# Patient Record
Sex: Female | Born: 1985 | Race: Black or African American | Hispanic: No | Marital: Single | Smoking: Never smoker
Health system: Southern US, Community
[De-identification: ages and names within clinical notes are randomized; demographics above are authoritative.]

## PROBLEM LIST (undated history)

## (undated) DIAGNOSIS — Z789 Other specified health status: Secondary | ICD-10-CM

## (undated) HISTORY — PX: NO PAST SURGERIES: SHX2092

## (undated) HISTORY — DX: Other specified health status: Z78.9

---
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
## 2006-03-22 ENCOUNTER — Ambulatory Visit (HOSPITAL_COMMUNITY): Admission: RE | Admit: 2006-03-22 | Discharge: 2006-03-22 | Payer: Self-pay | Admitting: Family Medicine

## 2006-04-19 ENCOUNTER — Ambulatory Visit (HOSPITAL_COMMUNITY): Admission: RE | Admit: 2006-04-19 | Discharge: 2006-04-19 | Payer: Self-pay | Admitting: Family Medicine

## 2006-05-03 ENCOUNTER — Ambulatory Visit (HOSPITAL_COMMUNITY): Admission: RE | Admit: 2006-05-03 | Discharge: 2006-05-03 | Payer: Self-pay | Admitting: Family Medicine

## 2006-05-25 ENCOUNTER — Ambulatory Visit (HOSPITAL_COMMUNITY): Admission: RE | Admit: 2006-05-25 | Discharge: 2006-05-25 | Payer: Self-pay | Admitting: Family Medicine

## 2006-06-22 ENCOUNTER — Ambulatory Visit (HOSPITAL_COMMUNITY): Admission: RE | Admit: 2006-06-22 | Discharge: 2006-06-22 | Payer: Self-pay | Admitting: Family Medicine

## 2006-07-20 ENCOUNTER — Ambulatory Visit (HOSPITAL_COMMUNITY): Admission: RE | Admit: 2006-07-20 | Discharge: 2006-07-20 | Payer: Self-pay | Admitting: Family Medicine

## 2006-08-17 ENCOUNTER — Ambulatory Visit (HOSPITAL_COMMUNITY): Admission: RE | Admit: 2006-08-17 | Discharge: 2006-08-17 | Payer: Self-pay | Admitting: Family Medicine

## 2007-12-25 ENCOUNTER — Emergency Department (HOSPITAL_COMMUNITY): Admission: EM | Admit: 2007-12-25 | Discharge: 2007-12-25 | Payer: Self-pay | Admitting: Emergency Medicine

## 2008-02-19 ENCOUNTER — Emergency Department (HOSPITAL_COMMUNITY): Admission: EM | Admit: 2008-02-19 | Discharge: 2008-02-19 | Payer: Self-pay | Admitting: Family Medicine

## 2008-12-23 ENCOUNTER — Ambulatory Visit (HOSPITAL_COMMUNITY): Admission: RE | Admit: 2008-12-23 | Discharge: 2008-12-23 | Payer: Self-pay | Admitting: Obstetrics & Gynecology

## 2009-02-04 ENCOUNTER — Encounter: Payer: Self-pay | Admitting: Family Medicine

## 2009-02-04 ENCOUNTER — Ambulatory Visit (HOSPITAL_COMMUNITY): Admission: RE | Admit: 2009-02-04 | Discharge: 2009-02-04 | Payer: Self-pay | Admitting: Family Medicine

## 2009-02-11 IMAGING — US US OB FOLLOW-UP
2 series · 14 of 28 positions shown · non-contrast
Comparison: none

OBSTETRICAL ULTRASOUND:
 This ultrasound was performed in The [HOSPITAL], and the [facility] report will be stored to [REDACTED] PACS.

[Series 1: us ob follow-up · 13 of 100 slices shown (1 of 2)]
[im 4/100]
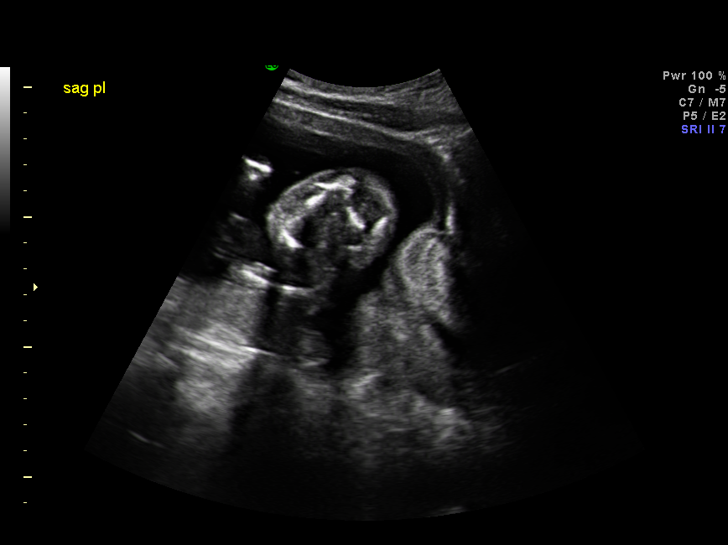
[im 12/100]
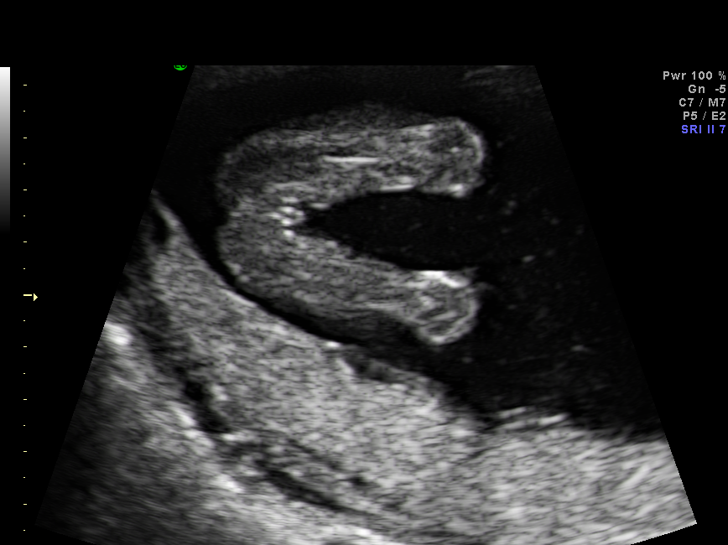
[im 20/100]
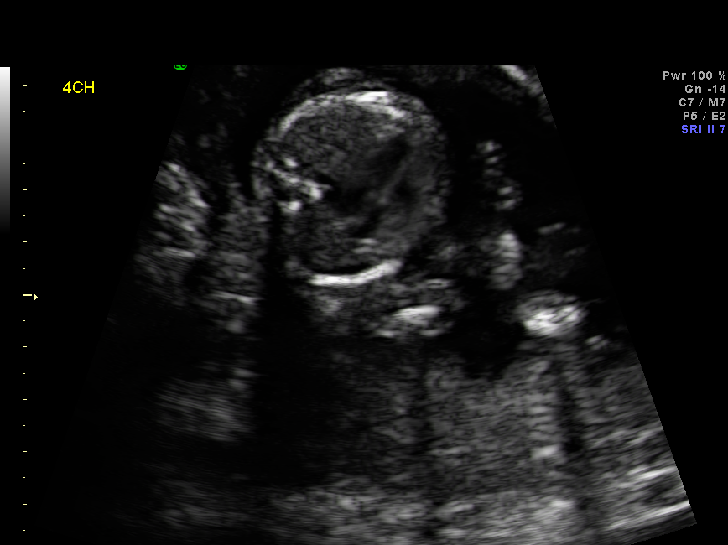
[im 28/100]
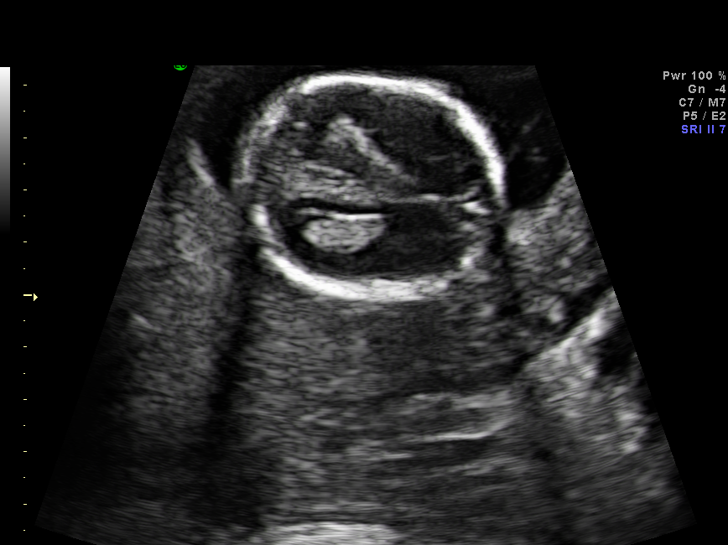
[im 36/100]
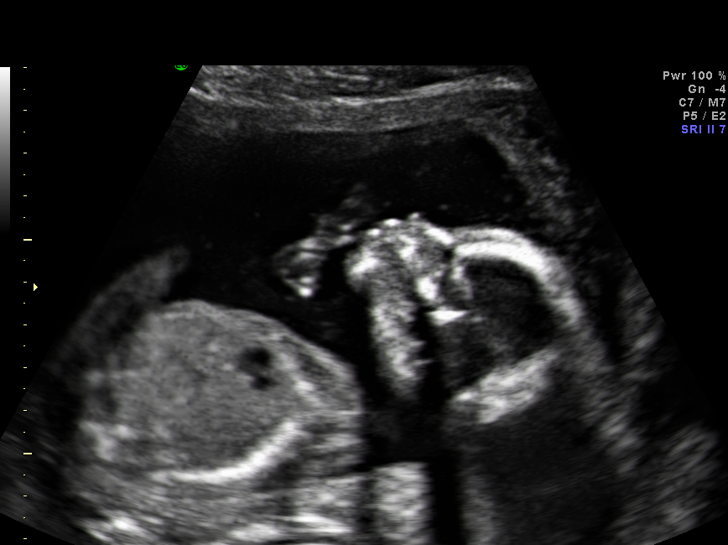
[im 44/100]
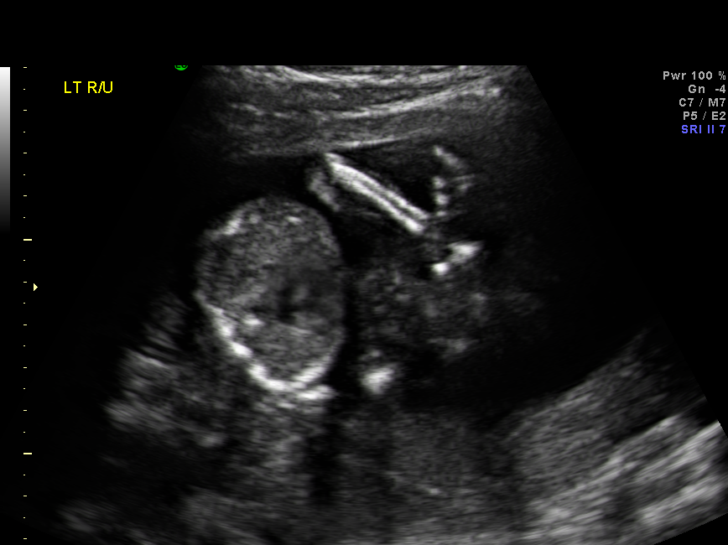
[im 52/100]
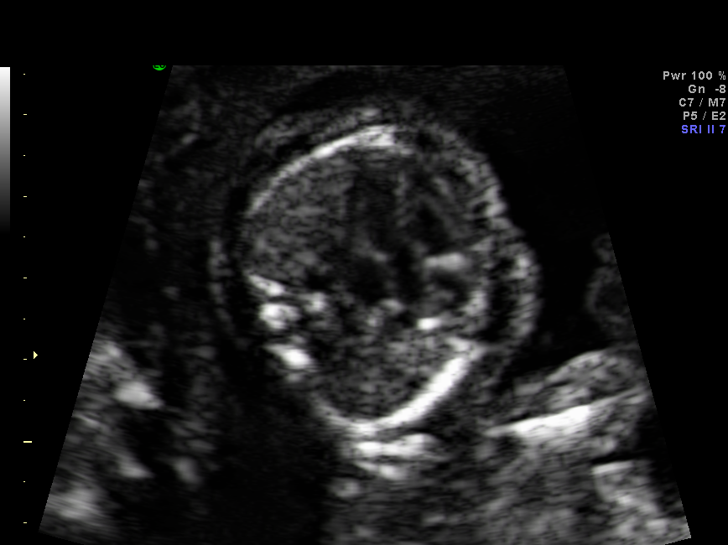
[im 60/100]
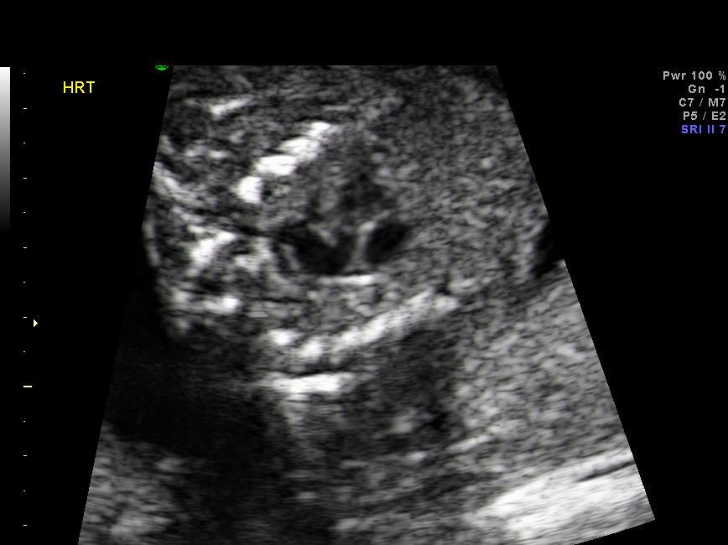
[im 68/100]
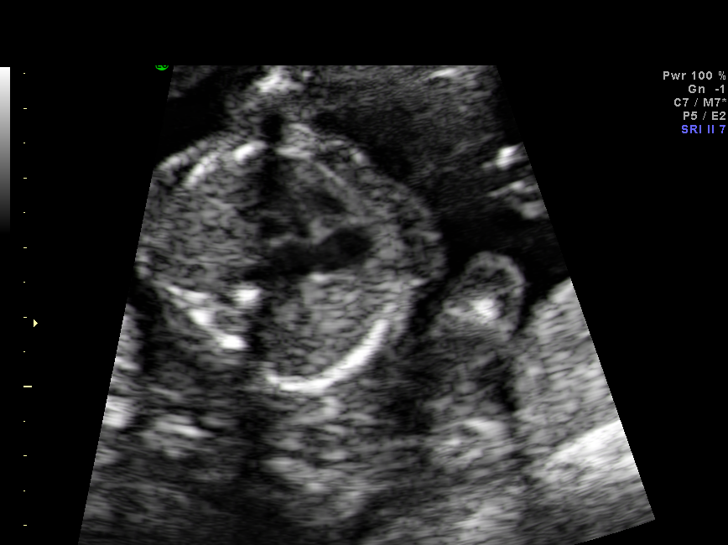
[im 76/100]
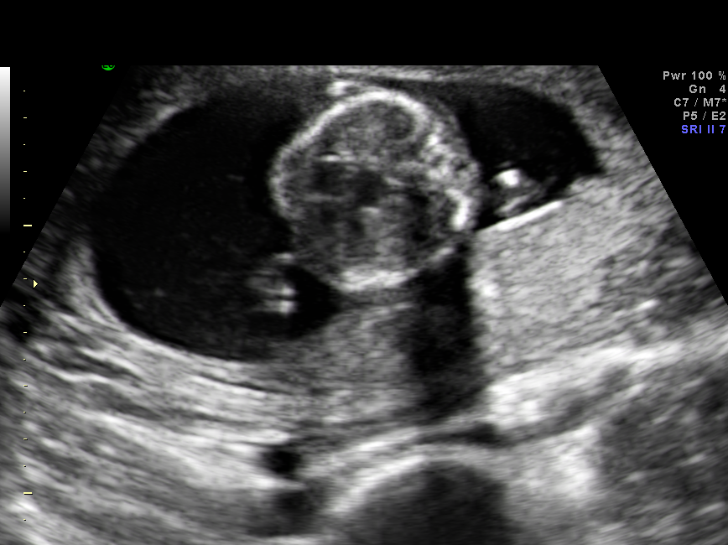
[im 84/100]
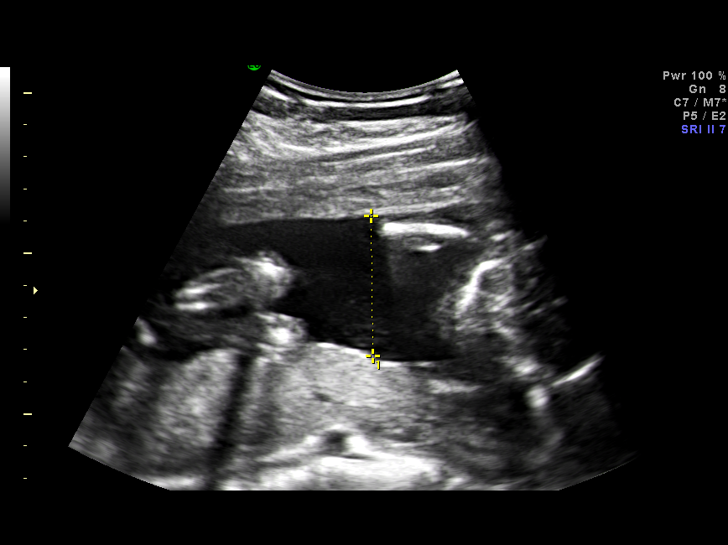
[im 92/100]
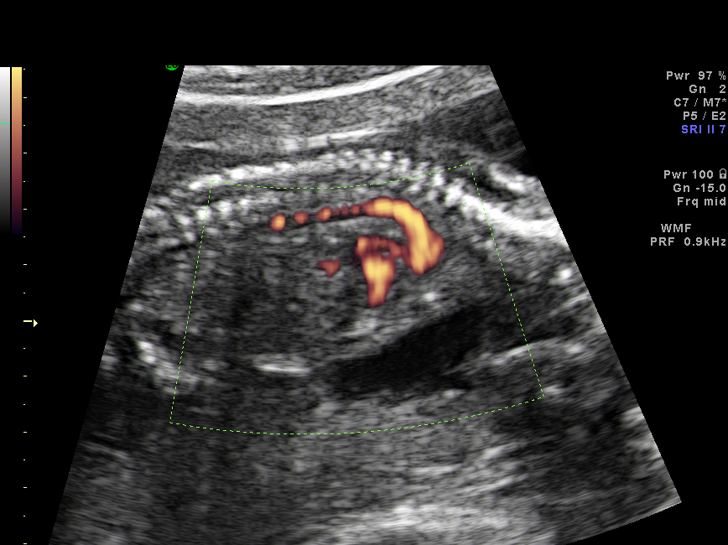
[im 100/100]
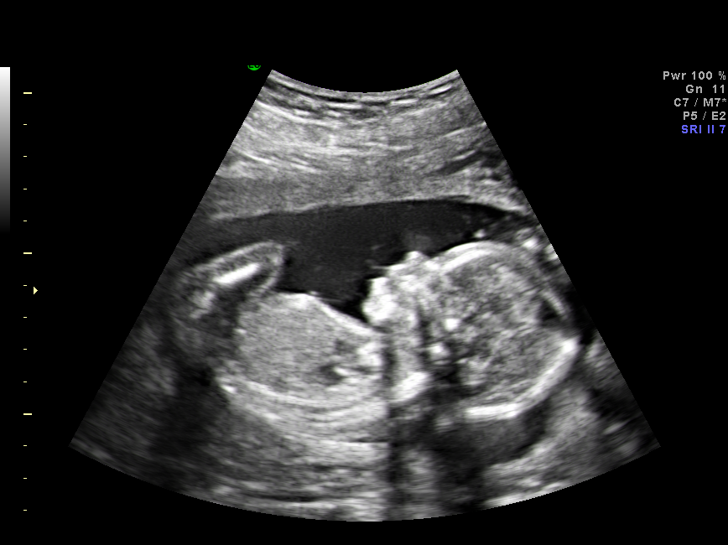

[Series 1: us ob follow-up · 9 acquisitions, 1 frame shown (2 of 2)]
[im 9/9]
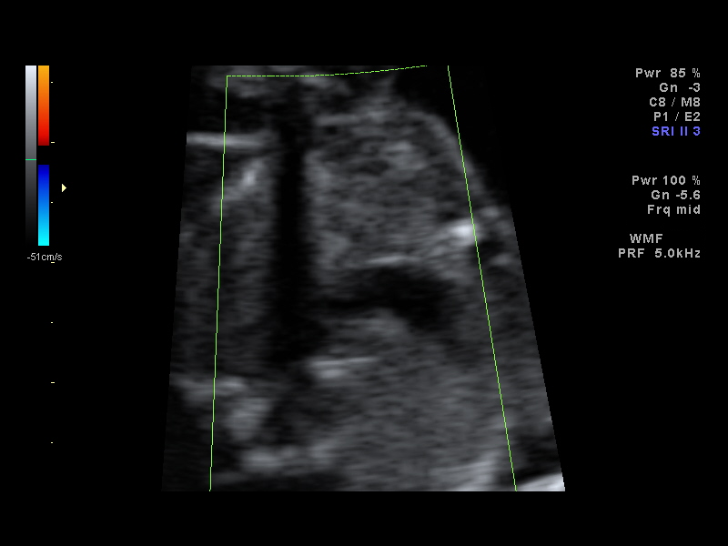

[14 of 28 positions shown; findings below may reference images not displayed]

IMPRESSION: The [facility] report has also been faxed to the ordering physician.

## 2009-05-29 ENCOUNTER — Ambulatory Visit: Payer: Self-pay | Admitting: Family Medicine

## 2009-05-29 ENCOUNTER — Inpatient Hospital Stay (HOSPITAL_COMMUNITY): Admission: AD | Admit: 2009-05-29 | Discharge: 2009-05-31 | Payer: Self-pay | Admitting: Obstetrics & Gynecology

## 2010-03-30 LAB — CBC
HCT: 31.5 % — ABNORMAL LOW (ref 36.0–46.0)
Hemoglobin: 10.9 g/dL — ABNORMAL LOW (ref 12.0–15.0)
MCHC: 34.5 g/dL (ref 30.0–36.0)
MCV: 94 fL (ref 78.0–100.0)
MCV: 94.2 fL (ref 78.0–100.0)
Platelets: 187 10*3/uL (ref 150–400)
Platelets: 189 10*3/uL (ref 150–400)
RBC: 3.34 MIL/uL — ABNORMAL LOW (ref 3.87–5.11)
RBC: 3.34 MIL/uL — ABNORMAL LOW (ref 3.87–5.11)
RDW: 14.5 % (ref 11.5–15.5)

## 2010-03-30 LAB — RPR: RPR Ser Ql: NONREACTIVE

## 2010-09-15 ENCOUNTER — Encounter: Payer: Self-pay | Admitting: *Deleted

## 2010-09-15 ENCOUNTER — Emergency Department (HOSPITAL_BASED_OUTPATIENT_CLINIC_OR_DEPARTMENT_OTHER)
Admission: EM | Admit: 2010-09-15 | Discharge: 2010-09-15 | Disposition: A | Discharge status: Home / Self Care | Payer: Self-pay | Attending: Emergency Medicine | Admitting: Emergency Medicine

## 2010-09-15 DIAGNOSIS — K0889 Other specified disorders of teeth and supporting structures: Secondary | ICD-10-CM

## 2010-09-15 DIAGNOSIS — K0381 Cracked tooth: Secondary | ICD-10-CM | POA: Insufficient documentation

## 2010-09-15 DIAGNOSIS — K089 Disorder of teeth and supporting structures, unspecified: Secondary | ICD-10-CM | POA: Insufficient documentation

## 2010-09-15 MED ORDER — PENICILLIN V POTASSIUM 500 MG PO TABS: 500.0000 mg | ORAL_TABLET | Freq: Three times a day (TID) | ORAL | Status: AC

## 2010-09-15 MED ORDER — PENICILLIN V POTASSIUM 250 MG PO TABS
500.0000 mg | ORAL_TABLET | Freq: Once | ORAL | Status: AC
  Administered 2010-09-15: 500 mg via ORAL
  Filled 2010-09-15 (×2): qty 1

## 2010-09-15 MED ORDER — ACETAMINOPHEN-CODEINE #3 300-30 MG PO TABS: 1.0000 | ORAL_TABLET | Freq: Four times a day (QID) | ORAL | Status: AC | PRN

## 2010-09-15 NOTE — ED Notes (Signed)
C/o tooth pain for several days

## 2010-09-15 NOTE — ED Provider Notes (Signed)
History     CSN: [id number] Arrival date & time: 09/15/2010  8:03 PM  Chief Complaint  Patient presents with  . Dental Pain   HPI Comments: Patient reports intermittent dental pain in the right upper mouth for a long time. However in the past she will take Tylenol and it would improve. Patient reports she did crack the teeth in that area a couple years prior when she bit into something hard. Over the last couple of days she's had worsening pain to that area improved after taking ibuprofen today the pain is persistent. No fevers or facial swelling. No difficulty opening her mouth, breathing, swallowing. She didn't call a dentist but she was not able to afford her co-pays her she came here to the emergency department.  Patient is a 25 y.o. female presenting with tooth pain. The history is provided by the patient.  Dental PainPrimary symptoms do not include shortness of breath.    History reviewed. No pertinent past medical history.  History reviewed. No pertinent past surgical history.  History reviewed. No pertinent family history.  History  Substance Use Topics  . Smoking status: Current Everyday Smoker -- 0.5 packs/day  . Smokeless tobacco: Not on file  . Alcohol Use: No    OB History    Grav Para Term Preterm Abortions TAB SAB Ect Mult Living                  Review of Systems  Constitutional: Negative.   HENT: Positive for dental problem.   Respiratory: Negative for shortness of breath.     Physical Exam  BP 128/84  Pulse 79  Temp(Src) 99 F (37.2 C) (Oral)  Resp 16  SpO2 100%  Physical Exam  Constitutional: She appears well-developed and well-nourished.  HENT:  Mouth/Throat:    Neck: Normal range of motion. Neck supple.  Pulmonary/Chest: Effort normal. No respiratory distress.  Lymphadenopathy:    She has no cervical adenopathy.    ED Course  Procedures  MDM Prescription for codeine, PCN and refer to on call dentist.        Gavin Pound. Oletta Lamas,  MD 09/15/10 2105

## 2010-09-15 NOTE — Discharge Instructions (Signed)
Dental Pain (Tooth Ache)   A tooth ache may be caused by cavities (tooth decay). Cavities expose the nerve of the tooth to air and hot or cold temperatures. It may come from an infection or abscess (also called a boil or furuncle) around your tooth. It is also often caused by dental caries (tooth decay). This causes the pain you are having.   DIAGNOSIS   Your caregiver can diagnose this problem by exam.   TREATMENT   If caused by an infection, it may be treated with medications which kill germs (antibiotics) and pain medications as prescribed by your caregiver. Take medications as directed.   Only take over-the-counter or prescription medicines for pain, discomfort, or fever as directed by your caregiver.   Whether the tooth ache today is caused by infection or dental disease, you should see your dentist as soon as possible for further care.   SEEK MEDICAL CARE:   The exam and treatment you received today has been provided on an emergency basis only. This is not a substitute for complete medical or dental care. If your problem worsens or new problems (symptoms) appear, and you are unable to meet with your dentist, call or return to this location.   SEEK IMMEDIATE MEDICAL CARE IF:   You develop a fever over 102.   You develop redness and swelling of your face, jaw, or neck.   You are unable to open your mouth.   You have severe pain uncontrolled by pain medicine.   MAKE SURE YOU:   Understand these instructions.   Will watch your condition.   Will get help right away if you are not doing well or get worse.   Document Released: 12/28/2004 Document Re-Released: 12/11/2007   ExitCare Patient Information 2011 [employer].

## 2011-08-02 ENCOUNTER — Emergency Department (HOSPITAL_COMMUNITY)
Admission: EM | Admit: 2011-08-02 | Discharge: 2011-08-02 | Disposition: A | Discharge status: Home / Self Care | Payer: Self-pay | Attending: Emergency Medicine | Admitting: Emergency Medicine

## 2011-08-02 DIAGNOSIS — F17200 Nicotine dependence, unspecified, uncomplicated: Secondary | ICD-10-CM | POA: Insufficient documentation

## 2011-08-02 DIAGNOSIS — K089 Disorder of teeth and supporting structures, unspecified: Secondary | ICD-10-CM | POA: Insufficient documentation

## 2011-08-02 DIAGNOSIS — K0889 Other specified disorders of teeth and supporting structures: Secondary | ICD-10-CM

## 2011-08-02 MED ORDER — IBUPROFEN 600 MG PO TABS: 600.0000 mg | ORAL_TABLET | Freq: Three times a day (TID) | ORAL | Status: AC | PRN

## 2011-08-02 MED ORDER — IBUPROFEN 200 MG PO TABS
600.0000 mg | ORAL_TABLET | Freq: Once | ORAL | Status: AC
  Administered 2011-08-02: 600 mg via ORAL
  Filled 2011-08-02: qty 3

## 2011-08-02 MED ORDER — HYDROCODONE-ACETAMINOPHEN 5-500 MG PO TABS: 1.0000 | ORAL_TABLET | Freq: Four times a day (QID) | ORAL | Status: AC | PRN

## 2011-08-02 NOTE — ED Provider Notes (Signed)
History     CSN: [id number]  Arrival date & time 08/02/11  4098   First MD Initiated Contact with Patient 08/02/11 [phone]     Chief complaint: toothache  (Consider location/radiation/quality/duration/timing/severity/associated sxs/prior treatment) The history is provided by the patient.  pt c/o toothpain since yesterday. Constant, dull, non radiating. States started after bit down on a chip. Was mod-sev initially but now much better. No meds/rx pta. No local dentist. No facial swelling or erythema. No fever or chills.     No past medical history on file.  No past surgical history on file.  No family history on file.  History  Substance Use Topics  . Smoking status: Current Everyday Smoker -- 0.5 packs/day  . Smokeless tobacco: Not on file  . Alcohol Use: No    OB History    Grav Para Term Preterm Abortions TAB SAB Ect Mult Living                  Review of Systems  Constitutional: Negative for fever.  HENT: Negative for sore throat and trouble swallowing.   Neurological: Negative for headaches.    Allergies  Review of patient's allergies indicates no known allergies.  Home Medications   Current Outpatient Rx  Name Route Sig Dispense Refill  . ACETAMINOPHEN 500 MG PO TABS Oral Take 1,000 mg by mouth every 6 (six) hours as needed. pain     . IBUPROFEN 200 MG PO TABS Oral Take 400 mg by mouth every 6 (six) hours as needed. pain       BP 127/89  Pulse 72  Temp 98.3 F (36.8 C) (Oral)  Resp 18  SpO2 100%  Physical Exam  Nursing note and vitals reviewed. Constitutional: She appears well-developed and well-nourished. No distress.  HENT:       Left upper tooth tenderness. Tooth firmly intact, not fractured. No gum swelling. No trismus. No facial swelling or redness.   Eyes: Conjunctivae are normal. No scleral icterus.  Neck: Neck supple. No tracheal deviation present.  Cardiovascular: Normal rate.   Pulmonary/Chest: Effort normal. No respiratory distress.    Abdominal: Normal appearance.  Musculoskeletal: She exhibits no edema.  Lymphadenopathy:    She has no cervical adenopathy.  Neurological: She is alert.  Skin: Skin is warm and dry. No rash noted.  Psychiatric: She has a normal mood and affect.    ED Course  Procedures (including critical care time)    MDM  Motrin po. Will give small quantity rxn vicodin.         Suzi Roots, MD 08/02/11 [phone]

## 2011-10-18 IMAGING — US US OB DETAIL+14 WK
2 series · 14 of 28 positions shown · non-contrast
Comparison: none

OBSTETRICAL ULTRASOUND:
 This ultrasound exam was performed in the [HOSPITAL] Ultrasound Department.  The OB US report was generated in the AS system, and faxed to the ordering physician.  This report is also available in [HOSPITAL]?s [employer] and in [REDACTED] PACS.

[Series 1: us ob detail +14 wk · 0.22mm/px · 1 of 3 slices shown (1 of 2)]
[im 3/3]
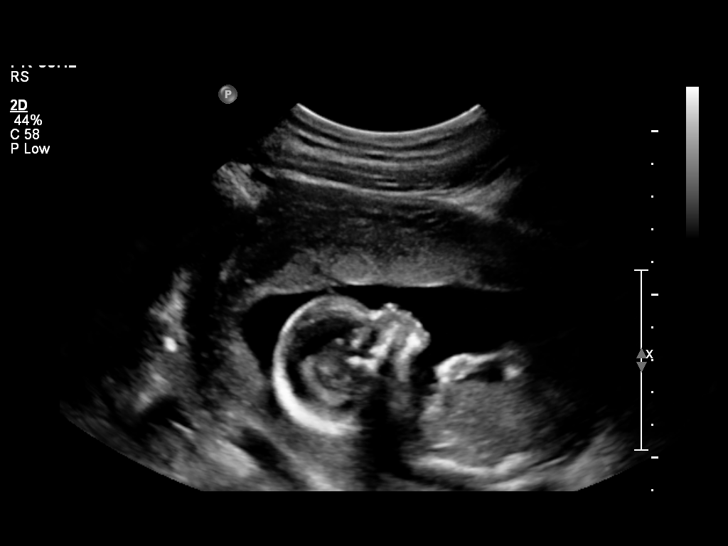

[Series 1: us ob detail +14 wk · 13 of 37 slices shown (2 of 2)]
[im 2/37]
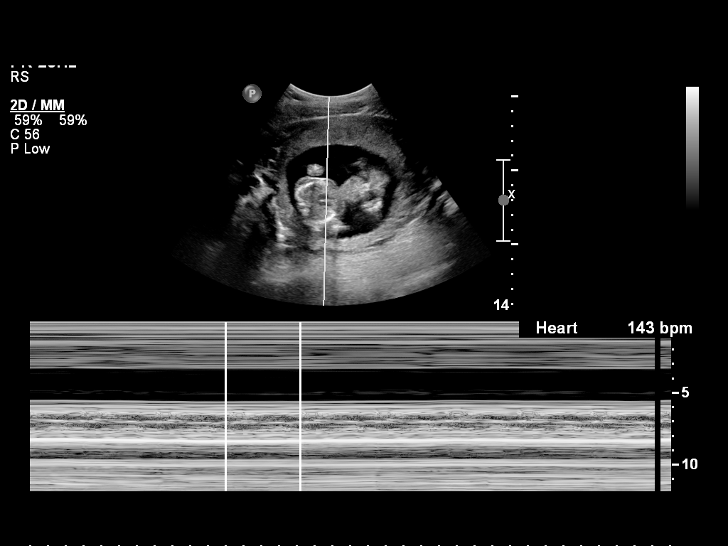
[im 5/37]
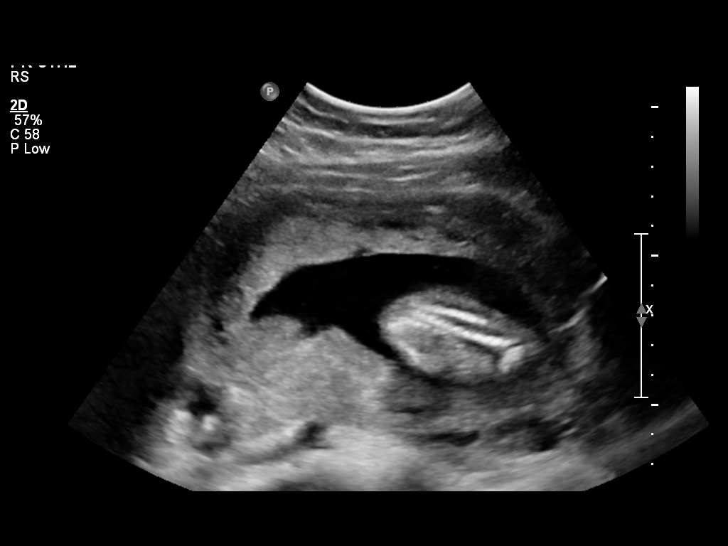
[im 8/37]
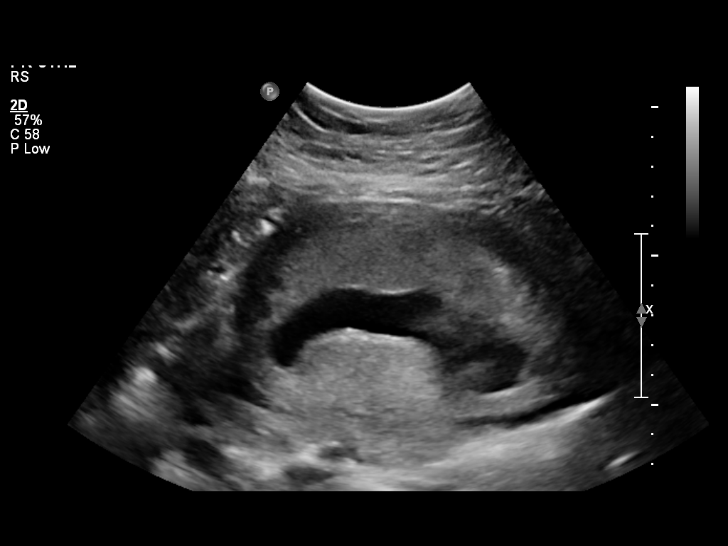
[im 11/37]
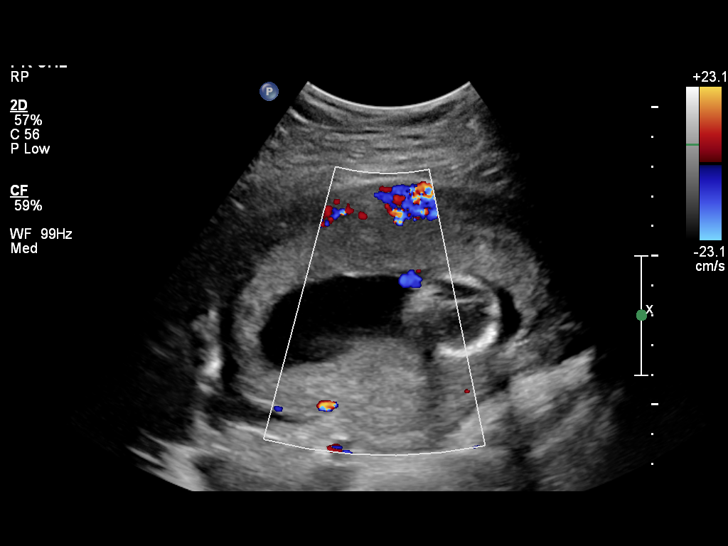
[im 13/37]
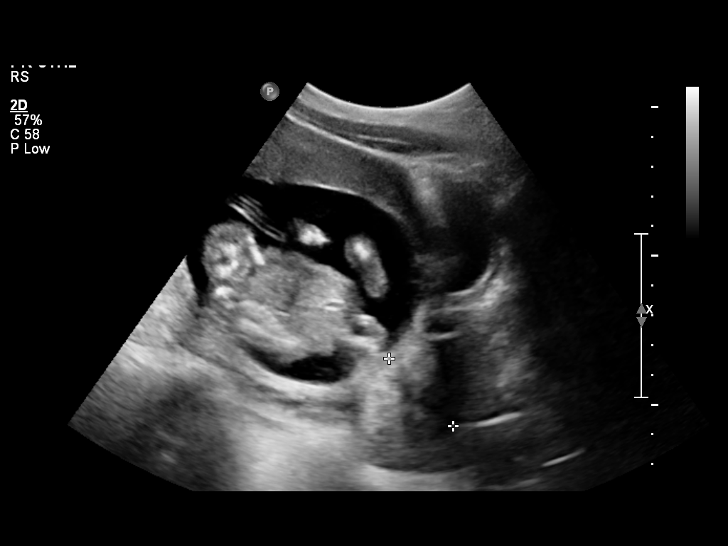
[im 16/37]
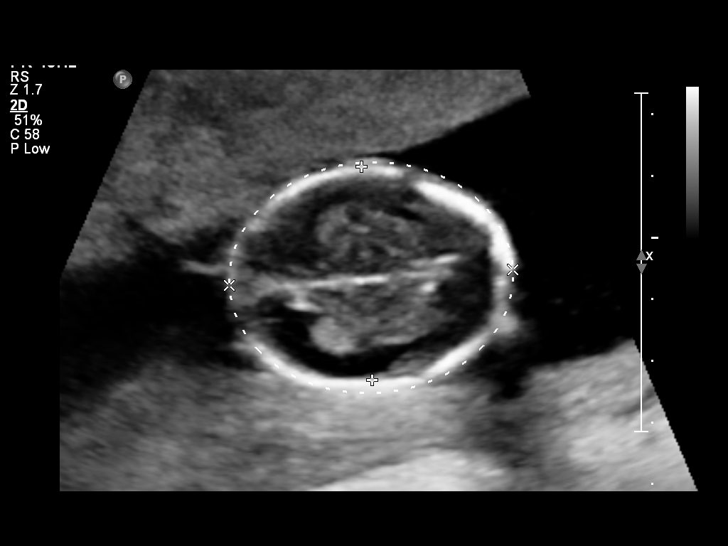
[im 19/37]
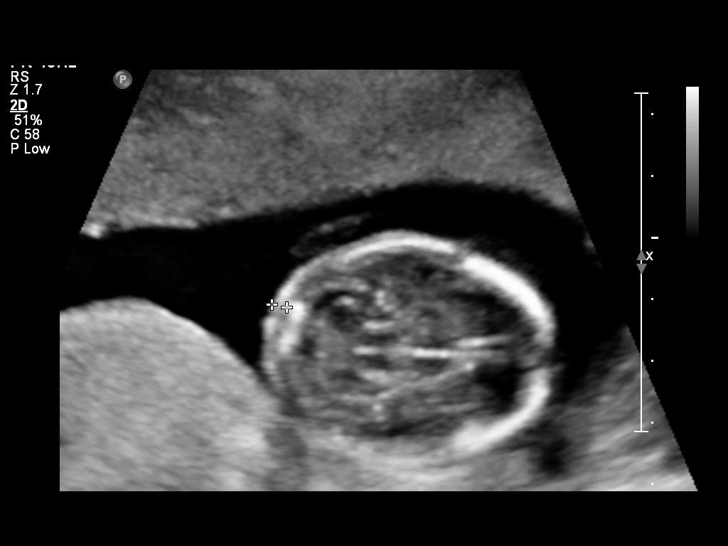
[im 22/37]
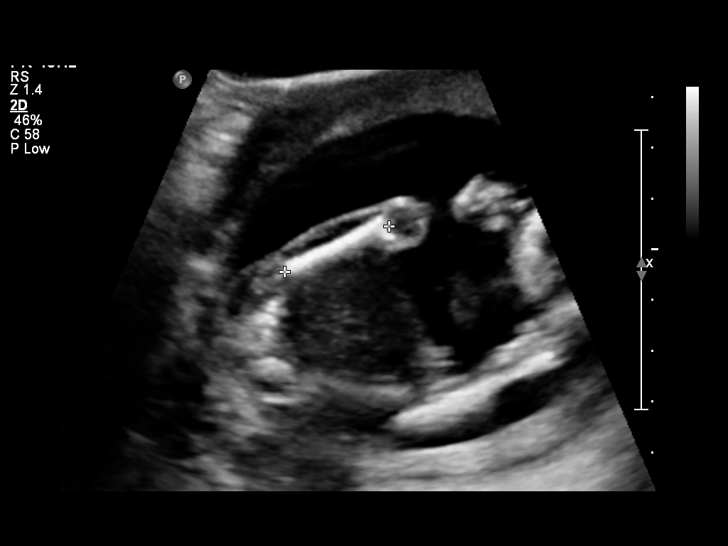
[im 25/37]
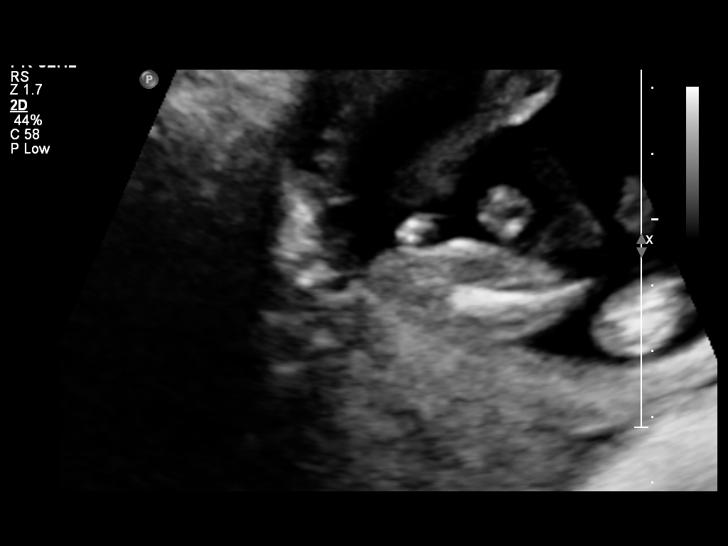
[im 28/37]
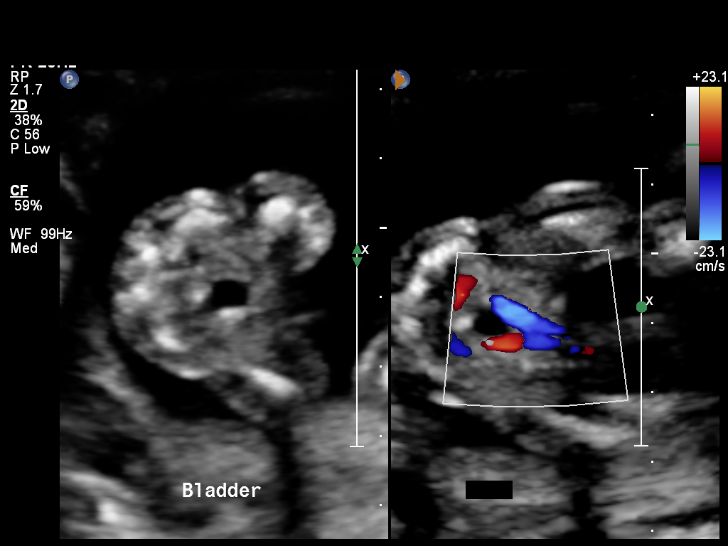
[im 31/37]
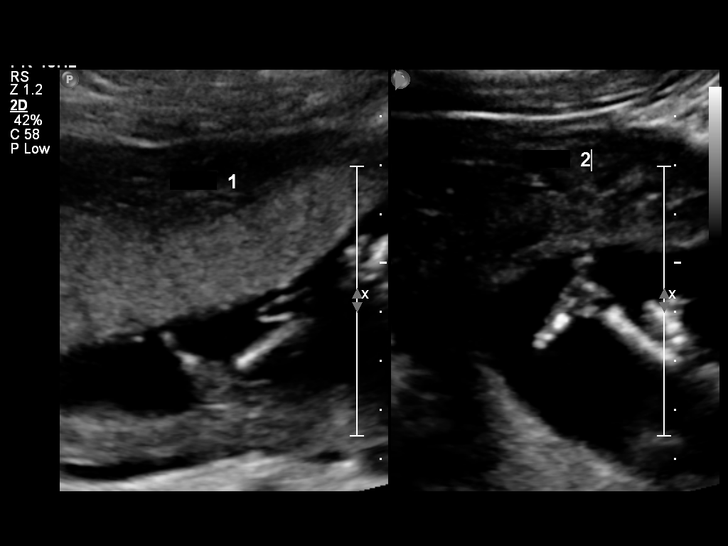
[im 34/37]
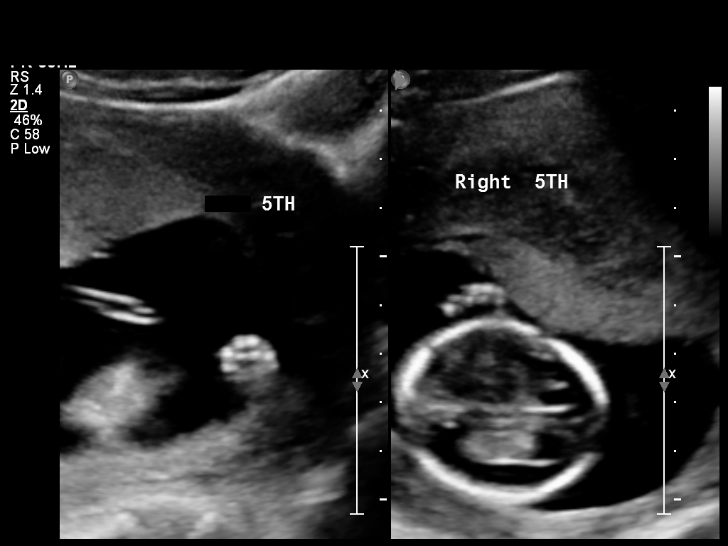
[im 37/37]
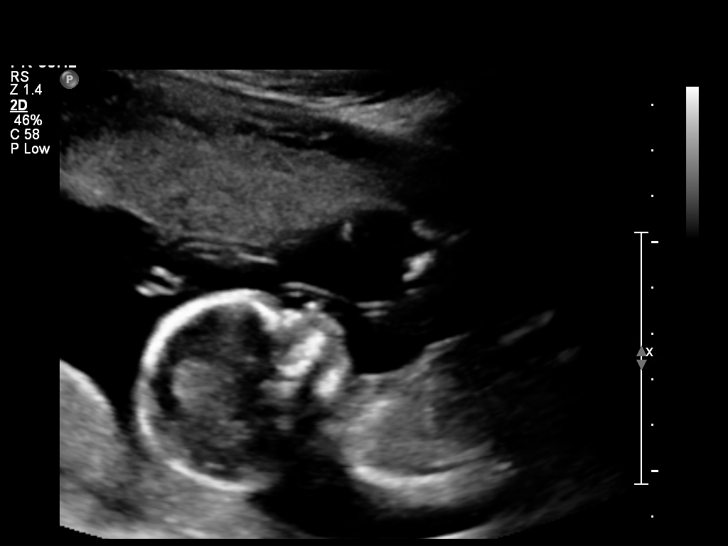

[14 of 28 positions shown; findings below may reference images not displayed]

IMPRESSION: See [facility] Obstetric US report.

## 2012-06-10 ENCOUNTER — Encounter (HOSPITAL_COMMUNITY): Payer: Self-pay | Admitting: Emergency Medicine

## 2012-06-10 DIAGNOSIS — Y92009 Unspecified place in unspecified non-institutional (private) residence as the place of occurrence of the external cause: Secondary | ICD-10-CM | POA: Insufficient documentation

## 2012-06-10 DIAGNOSIS — F17200 Nicotine dependence, unspecified, uncomplicated: Secondary | ICD-10-CM | POA: Insufficient documentation

## 2012-06-10 DIAGNOSIS — Y9389 Activity, other specified: Secondary | ICD-10-CM | POA: Insufficient documentation

## 2012-06-10 DIAGNOSIS — S40029A Contusion of unspecified upper arm, initial encounter: Secondary | ICD-10-CM | POA: Insufficient documentation

## 2012-06-10 DIAGNOSIS — W010XXA Fall on same level from slipping, tripping and stumbling without subsequent striking against object, initial encounter: Secondary | ICD-10-CM | POA: Insufficient documentation

## 2012-06-10 NOTE — ED Notes (Signed)
Patient states she fell earlier today on right shoulder.  Patient now with continuous pain in right shoulder.

## 2012-06-11 ENCOUNTER — Emergency Department (HOSPITAL_COMMUNITY)
Admission: EM | Admit: 2012-06-11 | Discharge: 2012-06-11 | Disposition: A | Discharge status: Home / Self Care | Payer: Self-pay | Attending: Emergency Medicine | Admitting: Emergency Medicine

## 2012-06-11 ENCOUNTER — Emergency Department (HOSPITAL_COMMUNITY)
Admit: 2012-06-11 | Discharge: 2012-06-11 | Disposition: A | Discharge status: Home / Self Care | Payer: Self-pay | Attending: Emergency Medicine | Admitting: Emergency Medicine

## 2012-06-11 DIAGNOSIS — S40021A Contusion of right upper arm, initial encounter: Secondary | ICD-10-CM

## 2012-06-11 MED ORDER — IBUPROFEN 400 MG PO TABS
800.0000 mg | ORAL_TABLET | Freq: Once | ORAL | Status: AC
  Administered 2012-06-11: 800 mg via ORAL
  Filled 2012-06-11: qty 2

## 2012-06-11 MED ORDER — IBUPROFEN 800 MG PO TABS: 600.0000 mg | ORAL_TABLET | Freq: Four times a day (QID) | ORAL | Status: DC | PRN

## 2012-06-11 NOTE — ED Provider Notes (Signed)
History     CSN: [id number]  Arrival date & time 06/10/12  2237   First MD Initiated Contact with Patient 06/11/12 0032      Chief Complaint  Patient presents with  . Shoulder Injury    (Consider location/radiation/quality/duration/timing/severity/associated sxs/prior treatment) HPI Comments: PAtient states she fell in her yard after tripping landing on her R arm/shoulder. Has not taken any meds PTA has full ROM but painful in certain positions, Denies numbness/tingling   Patient is a 27 y.o. female presenting with shoulder injury. The history is provided by the patient.  Shoulder Injury This is a new problem. The current episode started today. The problem occurs constantly. The problem has been unchanged. Associated symptoms include arthralgias. Pertinent negatives include no fever, joint swelling, numbness or weakness. Nothing aggravates the symptoms. She has tried nothing for the symptoms.    History reviewed. No pertinent past medical history.  History reviewed. No pertinent past surgical history.  History reviewed. No pertinent family history.  History  Substance Use Topics  . Smoking status: Current Every Day Smoker -- 0.50 packs/day    Types: Cigarettes  . Smokeless tobacco: Not on file  . Alcohol Use: No    OB History   Grav Para Term Preterm Abortions TAB SAB Ect Mult Living                  Review of Systems  Unable to perform ROS Constitutional: Negative for fever.  Musculoskeletal: Positive for arthralgias. Negative for joint swelling.  Neurological: Negative for weakness and numbness.  All other systems reviewed and are negative.    Allergies  Review of patient's allergies indicates no known allergies.  Home Medications   Current Outpatient Rx  Name  Route  Sig  Dispense  Refill  . acetaminophen (TYLENOL) 500 MG tablet   Oral   Take 1,000 mg by mouth every 6 (six) hours as needed. pain          . ibuprofen (ADVIL,MOTRIN) 200 MG tablet  Oral   Take 400 mg by mouth every 6 (six) hours as needed. pain            BP 124/79  Pulse 88  Temp(Src) 98.1 F (36.7 C) (Oral)  Resp 16  SpO2 100%  LMP 05/13/2012  Physical Exam  Nursing note and vitals reviewed. Constitutional: She appears well-developed and well-nourished.  HENT:  Head: Normocephalic.  Eyes: Pupils are equal, round, and reactive to light.  Neck: Normal range of motion.  Cardiovascular: Normal rate.   Pulmonary/Chest: Effort normal.  Musculoskeletal: Normal range of motion. She exhibits tenderness. She exhibits no edema.  Pain in upper arm with certain movements no swelling, bruising to arm   Neurological: She is alert.  Skin: Skin is warm. No rash noted. No erythema.    ED Course  Procedures (including critical care time)  Labs Reviewed - No data to display Dg Shoulder Right  06/11/2012   *RADIOLOGY REPORT*  Clinical Data: Fall.  Right shoulder pain and decreased range of motion.  RIGHT SHOULDER - 2+ VIEW  Comparison:  None.  Findings:  There is no evidence of fracture or dislocation.  There is no evidence of arthropathy or other focal bone abnormality. Soft tissues are unremarkable.  IMPRESSION: Negative.   Original Report Authenticated By: Myles Rosenthal, M.D.     No diagnosis found.    MDM   Xray negative for Fx         Arman Filter, NP 06/11/12 0127

## 2012-06-11 NOTE — ED Notes (Signed)
Pt states that her right shoulder hurts. Pt unable to fully move shoulder due to pain and being cautious about pain. Pt states that she is nervous something is wrong. Pt explained results of x-ray from NP

## 2012-06-11 NOTE — ED Provider Notes (Signed)
andMedical screening examination/treatment/procedure(s) were performed by non-physician practitioner and as supervising physician I was immediately available for consultation/collaboration.  Juliet Rude. Rubin Payor, MD 06/11/12 [phone]

## 2014-03-30 ENCOUNTER — Emergency Department (HOSPITAL_BASED_OUTPATIENT_CLINIC_OR_DEPARTMENT_OTHER)
Admission: EM | Admit: 2014-03-30 | Discharge: 2014-03-30 | Disposition: A | Discharge status: Home / Self Care | Payer: Self-pay | Attending: Emergency Medicine | Admitting: Emergency Medicine

## 2014-03-30 ENCOUNTER — Emergency Department (HOSPITAL_BASED_OUTPATIENT_CLINIC_OR_DEPARTMENT_OTHER): Payer: Self-pay

## 2014-03-30 ENCOUNTER — Encounter (HOSPITAL_BASED_OUTPATIENT_CLINIC_OR_DEPARTMENT_OTHER): Payer: Self-pay

## 2014-03-30 DIAGNOSIS — J3489 Other specified disorders of nose and nasal sinuses: Secondary | ICD-10-CM | POA: Insufficient documentation

## 2014-03-30 DIAGNOSIS — R51 Headache: Secondary | ICD-10-CM | POA: Insufficient documentation

## 2014-03-30 DIAGNOSIS — R6883 Chills (without fever): Secondary | ICD-10-CM | POA: Insufficient documentation

## 2014-03-30 DIAGNOSIS — Z720 Tobacco use: Secondary | ICD-10-CM | POA: Insufficient documentation

## 2014-03-30 DIAGNOSIS — R059 Cough, unspecified: Secondary | ICD-10-CM

## 2014-03-30 DIAGNOSIS — R0981 Nasal congestion: Secondary | ICD-10-CM | POA: Insufficient documentation

## 2014-03-30 DIAGNOSIS — E663 Overweight: Secondary | ICD-10-CM | POA: Insufficient documentation

## 2014-03-30 DIAGNOSIS — R05 Cough: Secondary | ICD-10-CM | POA: Insufficient documentation

## 2014-03-30 MED ORDER — SALINE SPRAY 0.65 % NA SOLN: 1.0000 | NASAL | Status: DC | PRN

## 2014-03-30 MED ORDER — FLUTICASONE PROPIONATE 50 MCG/ACT NA SUSP: 2.0000 | Freq: Every day | NASAL | Status: DC

## 2014-03-30 MED ORDER — LORATADINE 10 MG PO TABS: 10.0000 mg | ORAL_TABLET | Freq: Every day | ORAL | Status: DC

## 2014-03-30 NOTE — ED Notes (Signed)
Pt states cough since last night with chest and sinus congestion.  Productive cough with yellow sputum per patient.

## 2014-03-30 NOTE — ED Provider Notes (Signed)
CSN: [id number]     Arrival date & time 03/30/14  1039 History   First MD Initiated Contact with Patient 03/30/14 1056     Chief Complaint  Patient presents with  . Cough     (Consider location/radiation/quality/duration/timing/severity/associated sxs/prior Treatment) HPI  Patient presents with 2 days history of nasal congestion, cough and frontal headache.  Reports chills but no documented fevers.  Cough is productive of yellow sputum   CUrrent smoker.  Denies sore throat, ear pain, CP or SOB.  Reports frontal headache with sinus pressure.  Has not taken anything for her symptoms.    History reviewed. No pertinent past medical history. History reviewed. No pertinent past surgical history. No family history on file. History  Substance Use Topics  . Smoking status: Current Every Day Smoker -- 0.50 packs/day    Types: Cigarettes  . Smokeless tobacco: Not on file  . Alcohol Use: No   OB History    No data available     Review of Systems  Constitutional: Positive for chills. Negative for fever.  HENT: Positive for congestion and sinus pressure.   Respiratory: Positive for cough. Negative for chest tightness and shortness of breath.   Cardiovascular: Negative for chest pain.  Gastrointestinal: Negative for nausea, vomiting and abdominal pain.  Genitourinary: Negative for dysuria.  Skin: Negative for rash.  Neurological: Positive for headaches.  All other systems reviewed and are negative.     Allergies  Review of patient's allergies indicates no known allergies.  Home Medications   Prior to Admission medications   Medication Sig Start Date End Date Taking? Authorizing Provider  fluticasone (FLONASE) 50 MCG/ACT nasal spray Place 2 sprays into both nostrils daily. 03/30/14   Shon Batonourtney F Horton, MD  loratadine (CLARITIN) 10 MG tablet Take 1 tablet (10 mg total) by mouth daily. 03/30/14   Shon Batonourtney F Horton, MD  sodium chloride (OCEAN) 0.65 % SOLN nasal spray Place 1 spray into  both nostrils as needed for congestion. 03/30/14   Shon Batonourtney F Horton, MD   BP 119/85 mmHg  Pulse 88  Temp(Src) 99 F (37.2 C) (Oral)  Resp 20  Ht 5\' 4"  (1.626 m)  Wt 172 lb (78.019 kg)  BMI 29.51 kg/m2  SpO2 100%  LMP 03/30/2014 Physical Exam  Constitutional: She is oriented to person, place, and time. She appears well-developed and well-nourished. No distress.  overweight  HENT:  Head: Normocephalic and atraumatic.  Mouth/Throat: Oropharynx is clear and moist.  Congestion noted  Eyes: Conjunctivae are normal. Pupils are equal, round, and reactive to light.  Cardiovascular: Normal rate, regular rhythm and normal heart sounds.   No murmur heard. Pulmonary/Chest: Effort normal and breath sounds normal. No respiratory distress. She has no wheezes.  Abdominal: Soft. There is no tenderness.  Lymphadenopathy:    She has no cervical adenopathy.  Neurological: She is alert and oriented to person, place, and time.  Skin: Skin is warm and dry.  Psychiatric: She has a normal mood and affect.  Nursing note and vitals reviewed.   ED Course  Procedures (including critical care time) Labs Review Labs Reviewed - No data to display  Imaging Review Dg Chest 2 View  03/30/2014   CLINICAL DATA:  Prod cough, ha, chest and sinus congestion x2days. Smoker.  EXAM: CHEST  2 VIEW  COMPARISON:  None.  FINDINGS: Normal heart, mediastinum hila. Clear lungs. No pleural effusion or pneumothorax. Bony thorax and soft tissues are unremarkable.  IMPRESSION: Normal chest radiographs.   Electronically Signed  By: Amie Portland M.D.   On: 03/30/2014 11:31     EKG Interpretation None      MDM   Final diagnoses:  Nasal congestion  Cough    Patient presents with nasal congestion and cough.  Nontoxic.  Afebrile.  VS wnl.  Chest xray from triage without evidence of PNA.  Suspect upper respiratory virus vs allergies.  Discussed supportive care with the patient including nasal saline, flonase, claritin and  smoking cessation.  After history, exam, and medical workup I feel the patient has been appropriately medically screened and is safe for discharge home. Pertinent diagnoses were discussed with the patient. Patient was given return precautions.     Shon Baton, MD 04/01/14 1100

## 2014-03-30 NOTE — Discharge Instructions (Signed)
Upper Respiratory Infection, Adult An upper respiratory infection (URI) is also sometimes known as the common cold. The upper respiratory tract includes the nose, sinuses, throat, trachea, and bronchi. Bronchi are the airways leading to the lungs. Most people improve within 1 week, but symptoms can last up to 2 weeks. A residual cough may last even longer.  CAUSES Many different viruses can infect the tissues lining the upper respiratory tract. The tissues become irritated and inflamed and often become very moist. Mucus production is also common. A cold is contagious. You can easily spread the virus to others by oral contact. This includes kissing, sharing a glass, coughing, or sneezing. Touching your mouth or nose and then touching a surface, which is then touched by another person, can also spread the virus. SYMPTOMS  Symptoms typically develop 1 to 3 days after you come in contact with a cold virus. Symptoms vary from person to person. They may include:  Runny nose.  Sneezing.  Nasal congestion.  Sinus irritation.  Sore throat.  Loss of voice (laryngitis).  Cough.  Fatigue.  Muscle aches.  Loss of appetite.  Headache.  Low-grade fever. DIAGNOSIS  You might diagnose your own cold based on familiar symptoms, since most people get a cold 2 to 3 times a year. Your caregiver can confirm this based on your exam. Most importantly, your caregiver can check that your symptoms are not due to another disease such as strep throat, sinusitis, pneumonia, asthma, or epiglottitis. Blood tests, throat tests, and X-rays are not necessary to diagnose a common cold, but they may sometimes be helpful in excluding other more serious diseases. Your caregiver will decide if any further tests are required. RISKS AND COMPLICATIONS  You may be at risk for a more severe case of the common cold if you smoke cigarettes, have chronic heart disease (such as heart failure) or lung disease (such as asthma), or if  you have a weakened immune system. The very young and very old are also at risk for more serious infections. Bacterial sinusitis, middle ear infections, and bacterial pneumonia can complicate the common cold. The common cold can worsen asthma and chronic obstructive pulmonary disease (COPD). Sometimes, these complications can require emergency medical care and may be life-threatening. PREVENTION  The best way to protect against getting a cold is to practice good hygiene. Avoid oral or hand contact with people with cold symptoms. Wash your hands often if contact occurs. There is no clear evidence that vitamin C, vitamin E, echinacea, or exercise reduces the chance of developing a cold. However, it is always recommended to get plenty of rest and practice good nutrition. TREATMENT  Treatment is directed at relieving symptoms. There is no cure. Antibiotics are not effective, because the infection is caused by a virus, not by bacteria. Treatment may include:  Increased fluid intake. Sports drinks offer valuable electrolytes, sugars, and fluids.  Breathing heated mist or steam (vaporizer or shower).  Eating chicken soup or other clear broths, and maintaining good nutrition.  Getting plenty of rest.  Using gargles or lozenges for comfort.  Controlling fevers with ibuprofen or acetaminophen as directed by your caregiver.  Increasing usage of your inhaler if you have asthma. Zinc gel and zinc lozenges, taken in the first 24 hours of the common cold, can shorten the duration and lessen the severity of symptoms. Pain medicines may help with fever, muscle aches, and throat pain. A variety of non-prescription medicines are available to treat congestion and runny nose. Your caregiver   can make recommendations and may suggest nasal or lung inhalers for other symptoms.  HOME CARE INSTRUCTIONS   Only take over-the-counter or prescription medicines for pain, discomfort, or fever as directed by your  caregiver.  Use a warm mist humidifier or inhale steam from a shower to increase air moisture. This may keep secretions moist and make it easier to breathe.  Drink enough water and fluids to keep your urine clear or pale yellow.  Rest as needed.  Return to work when your temperature has returned to normal or as your caregiver advises. You may need to stay home longer to avoid infecting others. You can also use a face mask and careful hand washing to prevent spread of the virus. SEEK MEDICAL CARE IF:   After the first few days, you feel you are getting worse rather than better.  You need your caregiver's advice about medicines to control symptoms.  You develop chills, worsening shortness of breath, or brown or red sputum. These may be signs of pneumonia.  You develop yellow or brown nasal discharge or pain in the face, especially when you bend forward. These may be signs of sinusitis.  You develop a fever, swollen neck glands, pain with swallowing, or white areas in the back of your throat. These may be signs of strep throat. SEEK IMMEDIATE MEDICAL CARE IF:   You have a fever.  You develop severe or persistent headache, ear pain, sinus pain, or chest pain.  You develop wheezing, a prolonged cough, cough up blood, or have a change in your usual mucus (if you have chronic lung disease).  You develop sore muscles or a stiff neck. Document Released: 06/23/2000 Document Revised: 03/22/2011 Document Reviewed: 04/04/2013 ExitCare Patient Information 2015 [employer]. This information is not intended to replace advice given to you by your health care provider. Make sure you discuss any questions you have with your health care provider.  

## 2014-03-30 NOTE — ED Notes (Signed)
Patient here with cough, congestion, and frontal headache x 2 days. Reports that the cough is productive with yellow sputum. No distress

## 2016-03-24 ENCOUNTER — Encounter (HOSPITAL_BASED_OUTPATIENT_CLINIC_OR_DEPARTMENT_OTHER): Payer: Self-pay

## 2016-03-24 ENCOUNTER — Emergency Department (HOSPITAL_BASED_OUTPATIENT_CLINIC_OR_DEPARTMENT_OTHER)
Admission: EM | Admit: 2016-03-24 | Discharge: 2016-03-24 | Disposition: A | Discharge status: Home / Self Care | Payer: Medicaid Other | Attending: Emergency Medicine | Admitting: Emergency Medicine

## 2016-03-24 DIAGNOSIS — O209 Hemorrhage in early pregnancy, unspecified: Secondary | ICD-10-CM | POA: Insufficient documentation

## 2016-03-24 DIAGNOSIS — Z3A21 21 weeks gestation of pregnancy: Secondary | ICD-10-CM | POA: Insufficient documentation

## 2016-03-24 DIAGNOSIS — Z87891 Personal history of nicotine dependence: Secondary | ICD-10-CM | POA: Diagnosis not present

## 2016-03-24 DIAGNOSIS — O4690 Antepartum hemorrhage, unspecified, unspecified trimester: Secondary | ICD-10-CM

## 2016-03-24 LAB — URINALYSIS, ROUTINE W REFLEX MICROSCOPIC
Bilirubin Urine: NEGATIVE
Glucose, UA: NEGATIVE mg/dL
Hgb urine dipstick: NEGATIVE
KETONES UR: NEGATIVE mg/dL
NITRITE: NEGATIVE
PH: 6.5 (ref 5.0–8.0)
PROTEIN: NEGATIVE mg/dL
Specific Gravity, Urine: 1.022 (ref 1.005–1.030)

## 2016-03-24 LAB — URINALYSIS, MICROSCOPIC (REFLEX)

## 2016-03-24 NOTE — ED Notes (Signed)
Per Wilkie Ayekristy at ob rapid response,  [facility], dr pinn said pt was safe to go home, if bleeding increased or started having pain to go to [facility], otherwise call office in am to be seen if [facility]

## 2016-03-24 NOTE — ED Triage Notes (Signed)
C/o vaginal spotting x 1 hour "a little pinkish when I wiped"-no pad in place-NAD-steady gait-pt is [redacted] weeks pregnant-[facility]

## 2016-03-24 NOTE — ED Notes (Signed)
Talked to [place] [facility] rapid response,  No monitor needed  Will call [facility] to see what they want to do

## 2016-03-24 NOTE — ED Notes (Signed)
ED Provider at bedside again discussing dispo plan of care

## 2016-03-24 NOTE — ED Provider Notes (Signed)
[facility] Provider Note   CSN: [id number] Arrival date & time: 03/24/16  4010 By signing my name below, I, Bridgette Habermann, attest that this documentation has been prepared under the direction and in the presence of Everlene Farrier, PA-C. Electronically Signed: Bridgette Habermann, ED Scribe. 03/24/16. 7:58 PM.  History   Chief Complaint Chief Complaint  Patient presents with  . Vaginal Bleeding    HPI The history is provided by the patient. No language interpreter was used.   HPI Comments: Valerie Powers is a 31 y.o. [id number] female at approximately [redacted] weeks gestation with LMP 10/17/15 with no pertinent PMHx, who presents to the Emergency Department complaining of x1 episode of light pink vaginal spotting, present upon wiping, that began around 5PM earlier this evening. She denies any gush of fluids or further spotting or bleeding. She is followed [facility].   Pt reports miscarriage on third pregnancy. Denies h/o gestational DM, HTN, or preeclampsia. Pt further denies fever, chills, cough, trouble breathing, abdominal pain, back pain, nausea, vomiting, urinary frequency/urgency, vaginal discharge, dysuria, or any other associated symptoms. Pt is in no pain at this time.   OB-GYN: [facility]  History reviewed. No pertinent past medical history.  There are no active problems to display for this patient.   History reviewed. No pertinent surgical history.  OB History    Gravida Para Term Preterm AB Living   5 2 2     1    SAB TAB Ectopic Multiple Live Births                   Home Medications    Prior to Admission medications   Not on File    Family History No family history on file.  Social History Social History  Substance Use Topics  . Smoking status: Former Smoker    Packs/day: 0.50    Types: Cigarettes  . Smokeless tobacco: Never Used  . Alcohol use No     Allergies   Patient has no known allergies.   Review of Systems Review of Systems    Constitutional: Negative for chills and fever.  HENT: Negative for nosebleeds.   Eyes: Negative for visual disturbance.  Respiratory: Negative for cough and shortness of breath.   Cardiovascular: Negative for leg swelling.  Gastrointestinal: Negative for abdominal pain, blood in stool, nausea and vomiting.  Genitourinary: Positive for vaginal bleeding. Negative for dysuria, frequency, urgency and vaginal discharge.  Musculoskeletal: Negative for back pain.  Skin: Negative for rash and wound.  Neurological: Negative for syncope, light-headedness and numbness.     Physical Exam Updated Vital Signs BP 123/67 (BP Location: Left Arm)   Pulse 96   Temp 98.8 F (37.1 C) (Oral)   Resp 17   Ht 5\' 4"  (1.626 m)   Wt 95.7 kg   SpO2 100%   BMI 36.22 kg/m   Physical Exam  Constitutional: She appears well-developed and well-nourished. No distress.  Nontoxic appearing.  HENT:  Head: Normocephalic and atraumatic.  Mouth/Throat: Oropharynx is clear and moist.  Eyes: Conjunctivae are normal. Pupils are equal, round, and reactive to light. Right eye exhibits no discharge. Left eye exhibits no discharge.  Neck: Neck supple.  Cardiovascular: Normal rate, regular rhythm, normal heart sounds and intact distal pulses.  Exam reveals no gallop and no friction rub.   No murmur heard. Pulmonary/Chest: Effort normal and breath sounds normal. No respiratory distress. She has no wheezes. She has no rales.  Abdominal: Soft.  There is no tenderness. There is no guarding.  Gravid abdomen with fundal height to just below the umbilicus. No abdominal TTP.   Genitourinary:  Genitourinary Comments: Deferred.   Musculoskeletal: She exhibits no edema or tenderness.  No lower extremity edema or tenderness.  Lymphadenopathy:    She has no cervical adenopathy.  Neurological: She is alert. Coordination normal.  Skin: Skin is warm and dry. Capillary refill takes less than 2 seconds. No rash noted. She is not  diaphoretic. No erythema. No pallor.  Psychiatric: She has a normal mood and affect. Her behavior is normal.  Nursing note and vitals reviewed.    ED Treatments / Results  DIAGNOSTIC STUDIES: Oxygen Saturation is 100% on RA, normal by my interpretation.    COORDINATION OF CARE: 7:57 PM Discussed treatment plan with pt at bedside and pt agreed to plan.  Labs (all labs ordered are listed, but only abnormal results are displayed) Labs Reviewed  URINALYSIS, ROUTINE W REFLEX MICROSCOPIC - Abnormal; Notable for the following:       Result Value   APPearance CLOUDY (*)    Leukocytes, UA SMALL (*)    All other components within normal limits  URINALYSIS, MICROSCOPIC (REFLEX) - Abnormal; Notable for the following:    Bacteria, UA MANY (*)    Squamous Epithelial / LPF 6-30 (*)    All other components within normal limits    EKG  EKG Interpretation None       Radiology No results found.  Procedures Procedures (including critical care time)  Medications Ordered in ED Medications - No data to display   Initial Impression / Assessment and Plan / ED Course  I have reviewed the triage vital signs and the nursing notes.  Pertinent labs & imaging results that were available during my care of the patient were reviewed by me and considered in my medical decision making (see chart for details).    This is a 31 y.o. [id number] female at approximately [redacted] weeks gestation with LMP 10/17/15 with no pertinent PMHx, who presents to the Emergency Department complaining of x1 episode of light pink vaginal spotting, present upon wiping, that began around 5PM earlier this evening. She denies any gush of fluids or further spotting or bleeding. [facility].  On exam the patient is afebrile nontoxic appearing. She is normotensive. She is no lower extremity edema or tenderness. Her abdomen is soft and nontender to palpation. She is a gravid abdomen with a fundal height just below  the level of umbilicus. Fetal heart tones of 145.  Nursing staff in the emergency department contacted our rapid OB team. [facility] OB [facility] Pinn about the patient. She reports the patient is safe to be discharged at this time. No further testing needed. [facility] if she has increased bleeding, abdominal pain or new or worsening concerns. Otherwise, patient should call her OB office tomorrow to make an appointment for follow-up tomorrow. I discussed this plan with the patient and she agrees to this plan. She is ready for discharge. I discussed return precautions. I advised the patient to follow-up with their primary care provider this week. I advised the patient to return to the emergency department with new or worsening symptoms or new concerns. The patient verbalized understanding and agreement with plan.    This patient was discussed with Dr. Ranae PalmsYelverton who agrees with assessment and plan.   Final Clinical Impressions(s) / ED Diagnoses  Final diagnoses:  Vaginal bleeding in pregnancy    New Prescriptions New Prescriptions   No medications on file    I personally performed the services described in this documentation, which was scribed in my presence. The recorded information has been reviewed and is accurate.       Everlene Farrier, PA-C 03/24/16 2014    Loren Racer, MD 03/26/16 [phone]

## 2016-03-24 NOTE — Progress Notes (Signed)
[facility] notified about patient who presented to [facility] with complaints of Vaginal spotting and a pink tinge when wiping; patient is a G4P1 at 22 and 5/[redacted] weeks along in her pregnancy; she denies cramping or contractions per ED RN; patient stated [facility]; Dr Mora ApplPinn called about patient complaints; Doppler applied and 140s noted by ED staff; Orders given to have patient cleared by OB but to call to follow up with the OB office in the AM and if bleeding continues or worsens or decreased movement noted to come [facility];  orders passed on to ED staff

## 2016-03-24 NOTE — ED Notes (Signed)
ED Provider at bedside. 

## 2016-07-04 DIAGNOSIS — O09299 Supervision of pregnancy with other poor reproductive or obstetric history, unspecified trimester: Secondary | ICD-10-CM | POA: Insufficient documentation

## 2016-07-04 DIAGNOSIS — O0933 Supervision of pregnancy with insufficient antenatal care, third trimester: Secondary | ICD-10-CM | POA: Insufficient documentation

## 2016-07-04 DIAGNOSIS — Z3A37 37 weeks gestation of pregnancy: Secondary | ICD-10-CM | POA: Insufficient documentation

## 2020-03-30 ENCOUNTER — Emergency Department (HOSPITAL_BASED_OUTPATIENT_CLINIC_OR_DEPARTMENT_OTHER)
Admission: EM | Admit: 2020-03-30 | Discharge: 2020-03-30 | Disposition: A | Discharge status: Home / Self Care | Payer: Medicaid Other | Attending: Emergency Medicine | Admitting: Emergency Medicine

## 2020-03-30 ENCOUNTER — Other Ambulatory Visit: Payer: Self-pay

## 2020-03-30 ENCOUNTER — Encounter (HOSPITAL_BASED_OUTPATIENT_CLINIC_OR_DEPARTMENT_OTHER): Payer: Self-pay | Admitting: Emergency Medicine

## 2020-03-30 DIAGNOSIS — Z87891 Personal history of nicotine dependence: Secondary | ICD-10-CM | POA: Insufficient documentation

## 2020-03-30 DIAGNOSIS — M25511 Pain in right shoulder: Secondary | ICD-10-CM | POA: Diagnosis present

## 2020-03-30 DIAGNOSIS — M549 Dorsalgia, unspecified: Secondary | ICD-10-CM | POA: Diagnosis not present

## 2020-03-30 DIAGNOSIS — Y92410 Unspecified street and highway as the place of occurrence of the external cause: Secondary | ICD-10-CM | POA: Insufficient documentation

## 2020-03-30 DIAGNOSIS — M79651 Pain in right thigh: Secondary | ICD-10-CM | POA: Insufficient documentation

## 2020-03-30 DIAGNOSIS — M25512 Pain in left shoulder: Secondary | ICD-10-CM | POA: Diagnosis not present

## 2020-03-30 DIAGNOSIS — R519 Headache, unspecified: Secondary | ICD-10-CM | POA: Insufficient documentation

## 2020-03-30 MED ORDER — IBUPROFEN 400 MG PO TABS
600.0000 mg | ORAL_TABLET | Freq: Once | ORAL | Status: AC
  Administered 2020-03-30: 600 mg via ORAL
  Filled 2020-03-30: qty 1

## 2020-03-30 MED ORDER — CYCLOBENZAPRINE HCL 10 MG PO TABS: 10.0000 mg | ORAL_TABLET | Freq: Two times a day (BID) | ORAL | 0 refills | Status: DC | PRN
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
## 2020-03-30 NOTE — Discharge Instructions (Addendum)
Please read instructions below. °Apply ice to your areas of pain for 20 minutes at a time. °You can take 600 mg of Advil/ibuprofen every 6 hours as needed for pain. °You can take flexeril every 12 hours as needed for muscle spasm. Be aware this medication can make you drowsy. °Schedule an appointment with your primary care provider to follow up on your visit today. °Return to the ER for severely worsening headache, vision changes, if new numbness or weakness in your arms or legs, inability to urinate, inability to hold your bowels, or concerning symptoms. ° °

## 2020-03-30 NOTE — ED Notes (Signed)
ED Provider at bedside. 
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
## 2020-04-01 ENCOUNTER — Telehealth (HOSPITAL_COMMUNITY): Payer: Self-pay | Admitting: *Deleted

## 2020-04-01 NOTE — Telephone Encounter (Signed)
Pt called regarding which pharmacy Rx was e-scribed to.  RNCM reviewed chart to access After Visit Summary and found that Rx was printed and she can take it to any pharmacy of her choice.

## 2020-12-18 ENCOUNTER — Encounter (HOSPITAL_BASED_OUTPATIENT_CLINIC_OR_DEPARTMENT_OTHER): Payer: Self-pay | Admitting: Emergency Medicine

## 2020-12-18 ENCOUNTER — Other Ambulatory Visit: Payer: Self-pay

## 2020-12-18 ENCOUNTER — Emergency Department (HOSPITAL_BASED_OUTPATIENT_CLINIC_OR_DEPARTMENT_OTHER)
Admission: EM | Admit: 2020-12-18 | Discharge: 2020-12-18 | Disposition: A | Discharge status: Home / Self Care | Payer: Medicaid Other | Attending: Student | Admitting: Student

## 2020-12-18 DIAGNOSIS — B349 Viral infection, unspecified: Secondary | ICD-10-CM | POA: Insufficient documentation

## 2020-12-18 DIAGNOSIS — R519 Headache, unspecified: Secondary | ICD-10-CM | POA: Insufficient documentation

## 2020-12-18 DIAGNOSIS — Z20822 Contact with and (suspected) exposure to covid-19: Secondary | ICD-10-CM | POA: Insufficient documentation

## 2020-12-18 DIAGNOSIS — Z87891 Personal history of nicotine dependence: Secondary | ICD-10-CM | POA: Diagnosis not present

## 2020-12-18 DIAGNOSIS — R110 Nausea: Secondary | ICD-10-CM

## 2020-12-18 LAB — RESP PANEL BY RT-PCR (FLU A&B, COVID) ARPGX2
Influenza A by PCR: NEGATIVE
Influenza B by PCR: NEGATIVE
SARS Coronavirus 2 by RT PCR: NEGATIVE

## 2020-12-18 LAB — PREGNANCY, URINE: Preg Test, Ur: NEGATIVE

## 2020-12-18 MED ORDER — ONDANSETRON 4 MG PO TBDP
4.0000 mg | ORAL_TABLET | Freq: Once | ORAL | Status: AC
  Administered 2020-12-18: 4 mg via ORAL
  Filled 2020-12-18: qty 1

## 2020-12-18 MED ORDER — IBUPROFEN 400 MG PO TABS
600.0000 mg | ORAL_TABLET | Freq: Once | ORAL | Status: AC
  Administered 2020-12-18: 600 mg via ORAL
  Filled 2020-12-18: qty 1

## 2020-12-18 MED ORDER — ONDANSETRON 4 MG PO TBDP: 4.0000 mg | ORAL_TABLET | Freq: Three times a day (TID) | ORAL | 0 refills | Status: DC | PRN

## 2020-12-18 NOTE — ED Triage Notes (Signed)
Headache since last night , vomited x 1  last night , light sensitivity. Denies Hx migraine. Denies URI symptoms .

## 2020-12-18 NOTE — Discharge Instructions (Addendum)
Nausea medications have been sent to your pharmacy.  I will call you with results of your COVID and flu testing today.

## 2020-12-18 NOTE — ED Provider Notes (Signed)
[facility] EMERGENCY DEPARTMENT Provider Note   CSN: [id number] Arrival date & time: 12/18/20  7371     History Chief Complaint  Patient presents with   Headache    Valerie Powers is a 35 y.o. female who presents emergency department for evaluation of headache, nausea, vomiting.  Patient states that yesterday she felt lightheaded with associated headache and awoke this morning episode of nonbloody nonbilious vomiting.  She still feels generalized mild fatigue but comes to the emergency department because the nausea has not gone away.  She states her headache is a 2 out of 10 with no associated numbness, tingling, weakness or other neurologic complaints.  No visual deficits.  No history of migraine.  Denies diarrhea, abdominal pain, cough, fever or other systemic symptoms.   Headache Associated symptoms: nausea and vomiting   Associated symptoms: no abdominal pain, no back pain, no cough, no ear pain, no eye pain, no fever, no seizures and no sore throat       History reviewed. No pertinent past medical history.  There are no problems to display for this patient.   History reviewed. No pertinent surgical history.   OB History     Gravida  5   Para  3   Term  3   Preterm      AB      Living  2      SAB      IAB      Ectopic      Multiple      Live Births              No family history on file.  Social History   Tobacco Use   Smoking status: Former    Packs/day: 0.50    Types: Cigarettes   Smokeless tobacco: Never  Substance Use Topics   Alcohol use: No   Drug use: No    Home Medications Prior to Admission medications   Medication Sig Start Date End Date Taking? Authorizing Provider  cyclobenzaprine (FLEXERIL) 10 MG tablet Take 1 tablet (10 mg total) by mouth 2 (two) times daily as needed for muscle spasms. 03/30/20   Robinson, [place] N, PA-C    Allergies    Patient has no known allergies.  Review of Systems   Review of  Systems  Constitutional:  Negative for chills and fever.  HENT:  Negative for ear pain and sore throat.   Eyes:  Negative for pain and visual disturbance.  Respiratory:  Negative for cough and shortness of breath.   Cardiovascular:  Negative for chest pain and palpitations.  Gastrointestinal:  Positive for nausea and vomiting. Negative for abdominal pain.  Genitourinary:  Negative for dysuria and hematuria.  Musculoskeletal:  Negative for arthralgias and back pain.  Skin:  Negative for color change and rash.  Neurological:  Positive for light-headedness and headaches. Negative for seizures and syncope.  All other systems reviewed and are negative.  Physical Exam Updated Vital Signs BP (!) 126/92 (BP Location: Right Arm)   Pulse 99   Temp 98.3 F (36.8 C) (Oral)   Resp 16   Ht 5\' 4"  (1.626 m)   Wt 90.7 kg   LMP 12/18/2020 (Exact Date)   SpO2 100%   BMI 34.33 kg/m   Physical Exam Vitals and nursing note reviewed.  Constitutional:      General: She is not in acute distress.    Appearance: She is well-developed.  HENT:     Head: Normocephalic  and atraumatic.  Eyes:     Conjunctiva/sclera: Conjunctivae normal.  Cardiovascular:     Rate and Rhythm: Normal rate and regular rhythm.     Heart sounds: No murmur heard. Pulmonary:     Effort: Pulmonary effort is normal. No respiratory distress.     Breath sounds: Normal breath sounds.  Abdominal:     Palpations: Abdomen is soft.     Tenderness: There is no abdominal tenderness.  Musculoskeletal:        General: No swelling.     Cervical back: Neck supple.  Skin:    General: Skin is warm and dry.     Capillary Refill: Capillary refill takes less than 2 seconds.  Neurological:     Mental Status: She is alert.  Psychiatric:        Mood and Affect: Mood normal.    ED Results / Procedures / Treatments   Labs (all labs ordered are listed, but only abnormal results are displayed) Labs Reviewed  RESP PANEL BY RT-PCR (FLU A&B,  COVID) ARPGX2  PREGNANCY, URINE    EKG None  Radiology No results found.  Procedures Procedures   Medications Ordered in ED Medications  ondansetron (ZOFRAN-ODT) disintegrating tablet 4 mg (has no administration in time range)  ibuprofen (ADVIL) tablet 600 mg (has no administration in time range)    ED Course  I have reviewed the triage vital signs and the nursing notes.  Pertinent labs & imaging results that were available during my care of the patient were reviewed by me and considered in my medical decision making (see chart for details).    MDM Rules/Calculators/A&P                           Patient seen emergency department for evaluation of headache and vomiting.  On initial evaluation, patient complaining of very mild headache that is improved from yesterday and she feels nauseous.  Physical exam is unremarkable with an unremarkable neuro exam, no focal motor or sensory deficits, no cranial nerve deficits.  Cardiopulmonary and abdominal exams are unremarkable.  Urine pregnancy negative.  Flu and COVID-negative.  Patient given Zofran and ibuprofen which improved her symptoms.  Patient presentation likely consistent with viral URI and I have low suspicion for intracranial pathology at this time.  Patient well appearing on reevaluation and was discharged with a prescription for Zofran.  She was given strict return precautions which she voiced understanding she was discharged. Final Clinical Impression(s) / ED Diagnoses Final diagnoses:  None    Rx / DC Orders ED Discharge Orders     None        Tannya Gonet, MD 12/18/20 1511

## 2024-01-17 ENCOUNTER — Encounter

## 2024-01-23 ENCOUNTER — Ambulatory Visit: Admitting: *Deleted

## 2024-01-23 VITALS — BP 113/80 | HR 73 | Wt 235.0 lb

## 2024-01-23 DIAGNOSIS — O0992 Supervision of high risk pregnancy, unspecified, second trimester: Secondary | ICD-10-CM | POA: Diagnosis not present

## 2024-01-23 DIAGNOSIS — Z131 Encounter for screening for diabetes mellitus: Secondary | ICD-10-CM

## 2024-01-23 DIAGNOSIS — O0990 Supervision of high risk pregnancy, unspecified, unspecified trimester: Secondary | ICD-10-CM | POA: Insufficient documentation

## 2024-01-23 DIAGNOSIS — Z3A19 19 weeks gestation of pregnancy: Secondary | ICD-10-CM

## 2024-01-23 MED ORDER — BLOOD PRESSURE KIT DEVI: 1.0000 | 0 refills | Status: AC

## 2024-01-23 MED ORDER — PREPLUS 27-1 MG PO TABS: 1.0000 | ORAL_TABLET | Freq: Every day | ORAL | 13 refills | Status: AC

## 2024-01-23 NOTE — Progress Notes (Signed)
 New OB Intake  I connected with Suzen CHRISTELLA Pence  on 01/23/2024 at  2:10 PM EST by In Person Visit and verified that I am speaking with the correct person using two identifiers. Nurse is located at [facility] and pt is located at [place].  I discussed the limitations, risks, security and privacy concerns of performing an evaluation and management service by telephone and the availability of in person appointments. I also discussed with the patient that there may be a patient responsible charge related to this service. The patient expressed understanding and agreed to proceed.  I explained I am completing New OB Intake today. We discussed EDD of 06/18/24  based on LMP of 09/12/23 (Needs confirmation by MFM). Pt is [id number]. I reviewed her allergies, medications and Medical/Surgical/OB history.    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 Discussed the nature of our practice with multiple providers including residents and students as well as female and female providers. Due to the size of the practice, the delivering provider may not be the same as those providing prenatal care.   Patient is not interested in water birth.  MyChart/Babyscripts MyChart access verified. I explained pt will have some visits in office and some virtually. Babyscripts instructions given and order placed. Patient verifies receipt of registration text/e-mail. Account successfully created and app downloaded. If patient is a candidate for Optimized scheduling, add to sticky note.   Blood Pressure Cuff/Weight Scale Blood pressure cuff ordered for patient to pick-up from [employer]. Explained after first prenatal appt pt will check weekly and document in Babyscripts. Patient does not have weight scale; patient may purchase if they desire to track weight weekly in Babyscripts.  Anatomy US  Explained first scheduled US  will be around 19 weeks. Anatomy US  scheduled for TBD at TBD.  Is patient a candidate for Babyscripts Optimization? No, due to Risk Factors.   First visit review I reviewed new OB appt with patient. Explained pt will be seen by Nidia Daring, NP at first visit. Discussed [facility] genetic screening with patient. Requests Panorama. Routine prenatal labs collected at today's visit.   Last Pap No results found for: DIAGPAP  Rocky CHRISTELLA Ober, RN 01/23/2024  5:40 PM

## 2024-01-23 NOTE — Patient Instructions (Signed)
[facility] has a partnership with the [employer] to provide prenatal navigation for the most needed resources in our community. In order to see how we can help connect you to these resources we need consent to contact you. Please complete the very short consent using the link below:   English Link: [email or web address]  Spanish Link: [email or web address]  

## 2024-01-25 ENCOUNTER — Encounter: Payer: Self-pay | Admitting: Obstetrics and Gynecology

## 2024-01-25 ENCOUNTER — Ambulatory Visit (INDEPENDENT_AMBULATORY_CARE_PROVIDER_SITE_OTHER): Payer: Self-pay | Admitting: Obstetrics and Gynecology

## 2024-01-25 ENCOUNTER — Other Ambulatory Visit (HOSPITAL_COMMUNITY)
Admission: RE | Admit: 2024-01-25 | Discharge: 2024-01-25 | Disposition: A | Source: Ambulatory Visit | Attending: Obstetrics and Gynecology | Admitting: Obstetrics and Gynecology

## 2024-01-25 VITALS — BP 121/82 | HR 89 | Wt 234.8 lb

## 2024-01-25 DIAGNOSIS — O0932 Supervision of pregnancy with insufficient antenatal care, second trimester: Secondary | ICD-10-CM

## 2024-01-25 DIAGNOSIS — O09299 Supervision of pregnancy with other poor reproductive or obstetric history, unspecified trimester: Secondary | ICD-10-CM

## 2024-01-25 DIAGNOSIS — O0992 Supervision of high risk pregnancy, unspecified, second trimester: Secondary | ICD-10-CM | POA: Insufficient documentation

## 2024-01-25 DIAGNOSIS — O09292 Supervision of pregnancy with other poor reproductive or obstetric history, second trimester: Secondary | ICD-10-CM

## 2024-01-25 DIAGNOSIS — O26892 Other specified pregnancy related conditions, second trimester: Secondary | ICD-10-CM

## 2024-01-25 DIAGNOSIS — O0990 Supervision of high risk pregnancy, unspecified, unspecified trimester: Secondary | ICD-10-CM

## 2024-01-25 DIAGNOSIS — O219 Vomiting of pregnancy, unspecified: Secondary | ICD-10-CM | POA: Insufficient documentation

## 2024-01-25 DIAGNOSIS — Z3A19 19 weeks gestation of pregnancy: Secondary | ICD-10-CM | POA: Diagnosis present

## 2024-01-25 DIAGNOSIS — R12 Heartburn: Secondary | ICD-10-CM | POA: Insufficient documentation

## 2024-01-25 DIAGNOSIS — O0930 Supervision of pregnancy with insufficient antenatal care, unspecified trimester: Secondary | ICD-10-CM

## 2024-01-25 DIAGNOSIS — O26899 Other specified pregnancy related conditions, unspecified trimester: Secondary | ICD-10-CM

## 2024-01-25 MED ORDER — ASPIRIN 81 MG PO TBEC: 81.0000 mg | DELAYED_RELEASE_TABLET | Freq: Every day | ORAL | 2 refills | Status: AC

## 2024-01-25 MED ORDER — FAMOTIDINE 20 MG PO TABS: 20.0000 mg | ORAL_TABLET | Freq: Every day | ORAL | 0 refills | Status: AC

## 2024-01-25 MED ORDER — ONDANSETRON 4 MG PO TBDP: 4.0000 mg | ORAL_TABLET | Freq: Four times a day (QID) | ORAL | 0 refills | Status: AC | PRN

## 2024-01-25 NOTE — Progress Notes (Signed)
 "  INITIAL PRENATAL VISIT  Subjective:   Valerie Powers is being seen today for her first obstetrical visit.  She is at [redacted]w[redacted]d gestation by LMP Her obstetrical history is significant for none . Relationship with FOB: significant other, not living together. Patient intend to formula . Pregnancy history fully reviewed.  Patient reports nausea and heartburn .   Pap smear history: 02/12/16 Objective:    Obstetric History OB History  Gravida Para Term Preterm AB Living  5 3 3  0 1 2  SAB IAB Ectopic Multiple Live Births  1 0 0 0 3    # Outcome Date GA Lbr Len/2nd Weight Sex Type Anes PTL Lv  5 Current           4 Term 07/04/16 [redacted]w[redacted]d / 00:29 6 lb 4.1 oz (2.837 kg) M Vag-Spont EPI N LIV  3 SAB 07/2015          2 Term 05/29/09 [redacted]w[redacted]d  7 lb 11 oz (3.487 kg) F Vag-Spont None  LIV  1 Term 08/31/06 [redacted]w[redacted]d  7 lb 11 oz (3.487 kg) F Vag-Spont EPI  DEC    Past Medical History:  Diagnosis Date   Medical history non-contributory     Past Surgical History:  Procedure Laterality Date   NO PAST SURGERIES      Medications Ordered Prior to Encounter[1]  Allergies[2]  Social History:  reports that she has never smoked. She has never used smokeless tobacco. She reports that she does not drink alcohol and does not use drugs.  Family History  Problem Relation Age of Onset   Hypertension Mother    Diabetes Father     The following portions of the patient's history were reviewed and updated as appropriate: allergies, current medications, past family history, past medical history, past social history, past surgical history and problem list.  Review of Systems Review of Systems  Gastrointestinal:  Positive for nausea.       Heartburn   Powers other systems reviewed and are negative.    Physical Exam:  BP 121/82   Pulse 89   Wt 234 lb 12.8 oz (106.5 kg)   LMP 09/12/2023 (Approximate)   BMI 40.30 kg/m  CONSTITUTIONAL: Well-developed, well-nourished female in no acute distress.  HENT:   Normocephalic, atraumatic.   SKIN: Skin is warm and dry. MUSCULOSKELETAL: Normal range of motion NEUROLOGIC: Alert and oriented  PSYCHIATRIC: Normal mood and affect. Normal behavior.  RESPIRATORY: normal effort ABDOMEN: Soft PELVIC:Pelvic: normal appearing vulva with no masses, tenderness or lesions  VAGINA: normal appearing vagina with normal color and discharge, no lesions  CERVIX: normal appearing cervix without discharge or lesions, no CMT  Thin prep pap is done w HR HPV cotesting  Extremities:  No swelling or varicosities noted   Fetal Heart Rate (bpm): 146   Movement: Present       Assessment:    Pregnancy: [id number]  1. Supervision of high risk pregnancy, antepartum (Primary) BP and FHR normal Discussed recommendation for ASA  2. [redacted] weeks gestation of pregnancy  - Culture, OB Urine - Cervicovaginal ancillary only( [place]) - Cytology - PAP( [place]) - PANORAMA PRENATAL TEST  3. Heartburn during pregnancy, antepartum Limit heavy, greasy, spicy food, sit up 20-30 min after eating  - famotidine  (PEPCID ) 20 MG tablet; Take 1 tablet (20 mg total) by mouth at bedtime.  Dispense: 30 tablet; Refill: 0  4. Nausea and vomiting during pregnancy Small frequent meal  - ondansetron  (ZOFRAN -ODT) 4 MG disintegrating tablet;  Take 1 tablet (4 mg total) by mouth every 6 (six) hours as needed for nausea.  Dispense: 20 tablet; Refill: 0  5. Late prenatal care  6. 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 Prenatal vitamins. Problem list reviewed and updated. Reviewed in detail the nature of the practice with collaborative care between  Genetic screening discussed: NIPS/First trimester screen/Quad/AFP ordered. Role of ultrasound in pregnancy discussed; Anatomy US : ordered. Discussed clinic routines, schedule of care and testing, genetic screening options,  involvement of students and residents under the direct supervision of APPs and doctors and presence of female providers. Pt verbalized understanding.   Delores Nidia CROME, FNP       [1]  Current Outpatient Medications on File Prior to Visit  Medication Sig Dispense Refill   Blood Pressure Monitoring (BLOOD PRESSURE KIT) DEVI 1 Device by Does not apply route once a week. 1 each 0   Prenatal Vit-Fe Fumarate-FA (PREPLUS) 27-1 MG TABS Take 1 tablet by mouth daily. 30 tablet 13   No current facility-administered medications on file prior to visit.  [2] No Known Allergies  "

## 2024-01-25 NOTE — Progress Notes (Signed)
 Pt reports decreased appetite due to N/V. Would like RX for heartburn/indigestion. Safe med list provided to pt.

## 2024-01-26 ENCOUNTER — Ambulatory Visit: Payer: Self-pay | Admitting: Obstetrics and Gynecology

## 2024-01-26 DIAGNOSIS — O0990 Supervision of high risk pregnancy, unspecified, unspecified trimester: Secondary | ICD-10-CM

## 2024-01-26 LAB — CBC/D/PLT+RPR+RH+ABO+RUBIGG...
Antibody Screen: NEGATIVE
Basophils Absolute: 0.1 x10E3/uL (ref 0.0–0.2)
Basos: 1 %
EOS (ABSOLUTE): 0.3 x10E3/uL (ref 0.0–0.4)
Eos: 3 %
HCV Ab: NONREACTIVE
HIV Screen 4th Generation wRfx: NONREACTIVE
Hematocrit: 34.9 % (ref 34.0–46.6)
Hemoglobin: 12.1 g/dL (ref 11.1–15.9)
Hepatitis B Surface Ag: NEGATIVE
Immature Grans (Abs): 0 x10E3/uL (ref 0.0–0.1)
Immature Granulocytes: 0 %
Lymphocytes Absolute: 1.8 x10E3/uL (ref 0.7–3.1)
Lymphs: 22 %
MCH: 32.6 pg (ref 26.6–33.0)
MCHC: 34.7 g/dL (ref 31.5–35.7)
MCV: 94 fL (ref 79–97)
Monocytes Absolute: 0.6 x10E3/uL (ref 0.1–0.9)
Monocytes: 8 %
Neutrophils Absolute: 5.4 x10E3/uL (ref 1.4–7.0)
Neutrophils: 66 %
Platelets: 170 x10E3/uL (ref 150–450)
RBC: 3.71 x10E6/uL — ABNORMAL LOW (ref 3.77–5.28)
RDW: 13.3 % (ref 11.7–15.4)
RPR Ser Ql: NONREACTIVE
Rh Factor: POSITIVE
Rubella Antibodies, IGG: 2.69 {index}
WBC: 8.2 x10E3/uL (ref 3.4–10.8)

## 2024-01-26 LAB — CERVICOVAGINAL ANCILLARY ONLY
Bacterial Vaginitis (gardnerella): POSITIVE — AB
Candida Glabrata: NEGATIVE
Candida Vaginitis: POSITIVE — AB
Chlamydia: NEGATIVE
Comment: NEGATIVE
Comment: NEGATIVE
Comment: NEGATIVE
Comment: NEGATIVE
Comment: NEGATIVE
Comment: NORMAL
Neisseria Gonorrhea: NEGATIVE
Trichomonas: NEGATIVE

## 2024-01-26 LAB — HCV INTERPRETATION

## 2024-01-26 LAB — HEMOGLOBIN A1C
Est. average glucose Bld gHb Est-mCnc: 103 mg/dL
Hgb A1c MFr Bld: 5.2 % (ref 4.8–5.6)

## 2024-01-27 LAB — CULTURE, OB URINE

## 2024-01-27 LAB — URINE CULTURE, OB REFLEX

## 2024-01-30 ENCOUNTER — Ambulatory Visit: Payer: Self-pay | Admitting: Obstetrics and Gynecology

## 2024-01-30 LAB — CYTOLOGY - PAP
Adequacy: ABNORMAL
Comment: NEGATIVE

## 2024-01-30 MED ORDER — METRONIDAZOLE 500 MG PO TABS: 500.0000 mg | ORAL_TABLET | Freq: Two times a day (BID) | ORAL | 0 refills | Status: AC

## 2024-01-30 MED ORDER — TERCONAZOLE 0.4 % VA CREA: 1.0000 | TOPICAL_CREAM | Freq: Every day | VAGINAL | 0 refills | Status: AC

## 2024-02-03 LAB — PANORAMA PRENATAL TEST FULL PANEL:PANORAMA TEST PLUS 5 ADDITIONAL MICRODELETIONS: FETAL FRACTION: 5.6

## 2024-02-10 DIAGNOSIS — O0932 Supervision of pregnancy with insufficient antenatal care, second trimester: Secondary | ICD-10-CM | POA: Insufficient documentation

## 2024-02-20 ENCOUNTER — Encounter: Payer: Self-pay | Admitting: Obstetrics and Gynecology

## 2024-02-24 ENCOUNTER — Ambulatory Visit

## 2024-02-24 ENCOUNTER — Other Ambulatory Visit

## 2024-02-24 DIAGNOSIS — O0932 Supervision of pregnancy with insufficient antenatal care, second trimester: Secondary | ICD-10-CM

## 2024-02-24 DIAGNOSIS — O0990 Supervision of high risk pregnancy, unspecified, unspecified trimester: Secondary | ICD-10-CM
